# Patient Record
Sex: Male | Born: 1985 | Hispanic: Yes | Marital: Single | Smoking: Former smoker
Health system: Southern US, Community
[De-identification: ages and names within clinical notes are randomized; demographics above are authoritative.]

## PROBLEM LIST (undated history)

## (undated) DIAGNOSIS — K219 Gastro-esophageal reflux disease without esophagitis: Secondary | ICD-10-CM

## (undated) HISTORY — PX: CYST REMOVAL HAND: SHX6279

## (undated) HISTORY — DX: Gastro-esophageal reflux disease without esophagitis: K21.9

---

## 2010-09-30 ENCOUNTER — Emergency Department (HOSPITAL_COMMUNITY)
Admission: EM | Admit: 2010-09-30 | Discharge: 2010-09-30 | Payer: Self-pay | Source: Home / Self Care | Admitting: Emergency Medicine

## 2010-10-02 LAB — URINE CULTURE
Colony Count: NO GROWTH
Culture  Setup Time: 201201161547
Culture: NO GROWTH

## 2010-10-02 LAB — URINALYSIS, ROUTINE W REFLEX MICROSCOPIC
Bilirubin Urine: NEGATIVE
Hgb urine dipstick: NEGATIVE
Ketones, ur: NEGATIVE mg/dL
Nitrite: NEGATIVE
Protein, ur: NEGATIVE mg/dL
Specific Gravity, Urine: 1.022 (ref 1.005–1.030)
Urine Glucose, Fasting: NEGATIVE mg/dL
Urobilinogen, UA: 0.2 mg/dL (ref 0.0–1.0)
pH: 6 (ref 5.0–8.0)

## 2010-10-02 LAB — GC/CHLAMYDIA PROBE AMP, GENITAL
Chlamydia, DNA Probe: NEGATIVE
GC Probe Amp, Genital: NEGATIVE

## 2014-11-25 ENCOUNTER — Emergency Department (HOSPITAL_COMMUNITY): Payer: Self-pay

## 2014-11-25 ENCOUNTER — Encounter (HOSPITAL_COMMUNITY): Payer: Self-pay | Admitting: Emergency Medicine

## 2014-11-25 ENCOUNTER — Emergency Department (HOSPITAL_COMMUNITY)
Admission: EM | Admit: 2014-11-25 | Discharge: 2014-11-25 | Disposition: A | Discharge status: Home / Self Care | Payer: Self-pay | Attending: Emergency Medicine | Admitting: Emergency Medicine

## 2014-11-25 DIAGNOSIS — R0602 Shortness of breath: Secondary | ICD-10-CM | POA: Insufficient documentation

## 2014-11-25 DIAGNOSIS — R55 Syncope and collapse: Secondary | ICD-10-CM | POA: Insufficient documentation

## 2014-11-25 LAB — BASIC METABOLIC PANEL
Anion gap: 9 (ref 5–15)
BUN: 16 mg/dL (ref 6–23)
CO2: 27 mmol/L (ref 19–32)
Calcium: 9.5 mg/dL (ref 8.4–10.5)
Chloride: 103 mmol/L (ref 96–112)
Creatinine, Ser: 1.11 mg/dL (ref 0.50–1.35)
GFR calc Af Amer: >90 mL/min (ref >90)
GFR calc non Af Amer: 88 mL/min — ABNORMAL LOW (ref >90)
Glucose, Bld: 101 mg/dL — ABNORMAL HIGH (ref 70–99)
Potassium: 3.6 mmol/L (ref 3.5–5.1)
Sodium: 139 mmol/L (ref 135–145)

## 2014-11-25 LAB — CBC
HCT: 43.6 % (ref 39.0–52.0)
Hemoglobin: 15.7 g/dL (ref 13.0–17.0)
MCH: 27.9 pg (ref 26.0–34.0)
MCHC: 36 g/dL (ref 30.0–36.0)
MCV: 77.6 fL — ABNORMAL LOW (ref 78.0–100.0)
Platelets: 314 10*3/uL (ref 150–400)
RBC: 5.62 MIL/uL (ref 4.22–5.81)
RDW: 12.9 % (ref 11.5–15.5)
WBC: 7.8 10*3/uL (ref 4.0–10.5)

## 2014-11-25 LAB — D-DIMER, QUANTITATIVE: D-Dimer, Quant: <0.27 ug/mL-FEU (ref 0.00–0.48)

## 2014-11-25 MED ORDER — SODIUM CHLORIDE 0.9 % IV BOLUS (SEPSIS)
1000.0000 mL | Freq: Once | INTRAVENOUS | Status: AC
  Administered 2014-11-25: 1000 mL via INTRAVENOUS

## 2014-11-25 NOTE — Discharge Instructions (Signed)
Return to the ED with any concerns including fainting, chest pain, difficulty breathing, decreased level of alertness/lethargy, or any other alarming symptoms °

## 2014-11-25 NOTE — ED Notes (Signed)
Pt reports cp with radiation to shoulders and sob since Wednesday.  Pt lung sounds clear, no cardiac hx, alert and oriented.

## 2014-11-25 NOTE — ED Notes (Signed)
Pt c/o shortness of breath onset Thursday. Pt c/o b/l shoulder pain Thursday. Pt reports dizziness also. Symptoms began while at work.

## 2014-11-25 NOTE — ED Provider Notes (Signed)
CSN: [id number]     Arrival date & time 11/25/14  1511 History   First MD Initiated Contact with Patient 11/25/14 1956     Chief Complaint  Patient presents with  . Shortness of Breath     (Consider location/radiation/quality/duration/timing/severity/associated sxs/prior Treatment) HPI  Pt presenting with c/o shortness of breath and associated lightheadedness intermittently over the past 3 days.  No fever/chills.  No cough.  No leg swelling.   No hx of DVT/PE.  No recent travel/trauma/surgery.  No hx of heart or lung problems.  He does state he has had similar symptoms in the past but has not been evaluated by a doctor.  He states when he feels short of breath he has some chest tightness.  Denies shoulder pain as noted in triage note.  He works as a Airline pilotwaiter and at times while working has felt like he may faint.  No syncope. He has been eating and drinking liquids normally.  No vomiting or diarrhea.   There are no other associated systemic symptoms, there are no other alleviating or modifying factors.   History reviewed. No pertinent past medical history. History reviewed. No pertinent past surgical history. No family history on file. History  Substance Use Topics  . Smoking status: Never Smoker   . Smokeless tobacco: Not on file  . Alcohol Use: No    Review of Systems  ROS reviewed and all otherwise negative except for mentioned in HPI    Allergies  Review of patient's allergies indicates no known allergies.  Home Medications   Prior to Admission medications   Medication Sig Start Date End Date Taking? Authorizing Provider  ibuprofen (ADVIL,MOTRIN) 200 MG tablet Take 200 mg by mouth every 6 (six) hours as needed for mild pain or moderate pain.   Yes Historical Provider, MD   BP 107/64 mmHg  Pulse 76  Temp(Src) 98.3 F (36.8 C) (Oral)  Resp 24  Ht 5' (1.524 m)  Wt 175 lb (79.379 kg)  BMI 34.18 kg/m2  SpO2 97%  Vitals reviewed Physical Exam  Physical Examination: General  appearance - alert, well appearing, and in no distress Mental status - alert, oriented to person, place, and time Eyes -no conjunctival injection, no scleral icterus Mouth - mucous membranes moist, pharynx normal without lesions Chest - clear to auscultation, no wheezes, rales or rhonchi, symmetric air entry, normal respiratory effort Heart - normal rate, regular rhythm, normal S1, S2, no murmurs, rubs, clicks or gallops Abdomen - soft, nontender, nondistended, no masses or organomegaly Extremities - peripheral pulses normal, no pedal edema, no clubbing or cyanosis Skin - normal coloration and turgor, no rashes  ED Course  Procedures (including critical care time) Labs Review Labs Reviewed  CBC - Abnormal; Notable for the following:    MCV 77.6 (*)    All other components within normal limits  BASIC METABOLIC PANEL - Abnormal; Notable for the following:    Glucose, Bld 101 (*)    GFR calc non Af Amer 88 (*)    All other components within normal limits  D-DIMER, QUANTITATIVE    Imaging Review Dg Chest 2 View (if Patient Has Fever And/or Copd)  11/25/2014   CLINICAL DATA:  Short of breath, dizziness, lethargy for 4 days  EXAM: CHEST  2 VIEW  COMPARISON:  None.  FINDINGS: The heart size and mediastinal contours are within normal limits. Both lungs are clear. The visualized skeletal structures are unremarkable.  IMPRESSION: No active cardiopulmonary disease.   Electronically Signed  By: Elige Ko   On: 11/25/2014 17:01     EKG Interpretation   Date/Time:  Saturday November 25 2014 22:27:10 EST Ventricular Rate:  66 PR Interval:  190 QRS Duration: 100 QT Interval:  385 QTC Calculation: 403 R Axis:   110 Text Interpretation:  Sinus rhythm Borderline right axis deviation No old  tracing to compare Confirmed by [facility]  MD, Idora Brosious [id number]) on 11/25/2014  10:31:23 PM      MDM   Final diagnoses:  Near syncope  Shortness of breath    Pt presenting with c/o shortness of breath  and near syncope over the past several days.  Workup is reassuring, with reassuring ekg, labs including d-dimer,  cxr reassuring and patient is not orthostatic.  Results d/w patient and he was given information about outpatient followup.  Unclear cause of his syptoms, but low suspciion for ACS, arrythmia, PE, pneumonia or other life threatening process at this time.   Xray images reviewed and interpreted by me as well.  Discharged with strict return precautions.  Pt agreeable with plan.     Jerelyn Scott, MD 11/25/14 [phone]

## 2014-11-25 NOTE — ED Notes (Signed)
Pt made aware to return if symptoms worsen or if any life threatening symptoms occur.   

## 2015-01-22 ENCOUNTER — Ambulatory Visit (INDEPENDENT_AMBULATORY_CARE_PROVIDER_SITE_OTHER): Payer: Self-pay | Admitting: Internal Medicine

## 2015-01-22 VITALS — BP 118/70 | HR 64 | Temp 98.0°F | Resp 16 | Ht 62.0 in | Wt 169.8 lb

## 2015-01-22 DIAGNOSIS — R102 Pelvic and perineal pain: Secondary | ICD-10-CM

## 2015-01-22 DIAGNOSIS — R519 Headache, unspecified: Secondary | ICD-10-CM

## 2015-01-22 DIAGNOSIS — R51 Headache: Secondary | ICD-10-CM

## 2015-01-22 NOTE — Progress Notes (Signed)
Subjective:  This chart was scribed for Thomas Siaobert Tashala Cumbo, MD by Stann Oresung-Kai Tsai, Medical Scribe. This patient was seen in Room 4 and the patient's care was started at 2:53 PM.     Patient ID: Thomas Payne, male    DOB: 08-08-1986, 29 y.o.   MRN: [id number]  HPI Thomas Payne is a 29 y.o. male who [facility] complaining of headache and dizziness that comes and goes  while at work starting 2 months ago. At that time he had a sudden episode of acute dizziness with a pop in his head. He also had shortness of breath and weakness and was taken to the emergency room. Evaluation there was negative as outlined in the chart. He states he is now getting a headache and dizziness for a few seconds at most, coming in a random fashion and not inhibiting continuing his activity. He denies of feeling nausea, coughing. Denies dyspnea on exertion. Denies chest pain or palpitations. No syncope. Denies sleep issues. No past history of illness requiring medication.  Previously, he saw a doctor at [facility] for months ago for his testicular and groin pain and was prescribed to take anti inflammatory medication and antibiotics even though all his studies documented no evidence of any infection as outlined in the chart. He states relief after taking medication (amoxicillin $70 and Relafen $96) but pain came back recently. He reports of testicular pain sometimes in the left and sometimes in the right. He also mentions 2 nocturia episodes per night. Never has dysuria or discharge or problems with sexual functioning. Married with only 1 partner. 2 kids. The evaluation in the chart indicates testicular ultrasound normal among other studies. He took time off from work for a month and has since returned. He reports the same pain for 5 years or more and no one is ever found an answer. Denies constipation and diarrhea. He's never had a prostate infection.  He denies asthma, DM, and HTN.      Review of  Systems  Constitutional: Negative for fever, activity change and appetite change.       He sometimes has feelings of fatigue at work His exercise habits are Nil outside of work  Eyes: Negative for photophobia and visual disturbance.  Respiratory: Negative for cough and shortness of breath.   Cardiovascular: Negative for chest pain and palpitations.  Gastrointestinal: Negative for nausea and vomiting.  Genitourinary: Positive for testicular pain. Negative for dysuria, flank pain, discharge, penile swelling, scrotal swelling, difficulty urinating and genital sores.  Neurological: Positive for dizziness and headaches. Negative for tremors, speech difficulty and weakness.  Psychiatric/Behavioral: Negative for sleep disturbance.       He has a history of a mother who is frequently nervous but she is also had a stroke.       Objective:   Physical Exam  Constitutional: He is oriented to person, place, and time. He appears well-developed and well-nourished. No distress.  HENT:  Head: Normocephalic and atraumatic.  Mouth/Throat: Oropharynx is clear and moist. No oropharyngeal exudate.  Eyes: EOM are normal. Pupils are equal, round, and reactive to light.  Neck: Normal range of motion. Neck supple. No thyromegaly present.  Cardiovascular: Normal rate, regular rhythm, normal heart sounds and intact distal pulses.   No murmur heard. Pulmonary/Chest: Effort normal and breath sounds normal.  Abdominal: Bowel sounds are normal. He exhibits no distension and no mass. There is no tenderness. There is no rebound and no guarding.  Genitourinary:  Has mildly  tender palpation in both inguinal areas; testes are normal without mass or tenderness; phallus uncircumcised and normal; no inguinal hernia  Musculoskeletal: He exhibits no edema.  Neurological: He is alert and oriented to person, place, and time. No cranial nerve deficit.  Skin: Skin is dry. No rash noted.  Psychiatric:  He is not overly anxious or  depressed. His thought content is normal although very concerned about his symptoms. There are no flights of ideas.  Nursing note and vitals reviewed.         Assessment & Plan:  Pelvic pain in male  Nonintractable episodic headache, unspecified headache type  At this point I have no etiology for any of his concerns. It seems that all the correct test were done and everything is negative. He has had no episodes like the one he had in March that had him go as an emergency to the ER. I suggested he try to get back-physical shape and consider dietary changes that might allow weight loss. I have given him directions for the next area where evaluation might be necessary if he is not willing to accept the current state of affairs  I have completed the patient encounter in its entirety as documented by the scribe, with editing by me where necessary. Lashon Beringer P. Merla Richesoolittle, M.D.

## 2015-01-22 NOTE — Patient Instructions (Signed)
For headache/dizzy consider neurology--[facility] For testicle pain consider [facility]

## 2017-03-09 ENCOUNTER — Encounter (HOSPITAL_COMMUNITY): Payer: Self-pay | Admitting: Emergency Medicine

## 2017-03-09 ENCOUNTER — Ambulatory Visit (HOSPITAL_COMMUNITY)
Admission: EM | Admit: 2017-03-09 | Discharge: 2017-03-09 | Disposition: A | Discharge status: Home / Self Care | Payer: Self-pay | Attending: Family Medicine | Admitting: Family Medicine

## 2017-03-09 DIAGNOSIS — K228 Other specified diseases of esophagus: Secondary | ICD-10-CM

## 2017-03-09 DIAGNOSIS — R198 Other specified symptoms and signs involving the digestive system and abdomen: Secondary | ICD-10-CM

## 2017-03-09 DIAGNOSIS — K5901 Slow transit constipation: Secondary | ICD-10-CM

## 2017-03-09 DIAGNOSIS — R0600 Dyspnea, unspecified: Secondary | ICD-10-CM

## 2017-03-09 NOTE — ED Provider Notes (Signed)
CSN: [id number]     Arrival date & time 03/09/17  1410 History   First MD Initiated Contact with Patient 03/09/17 1538     Chief Complaint  Patient presents with  . 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 He is able to eat and drink normally. No regurgitation or vomiting. Denies abdominal pain. He also complains of shortness of breath. It tends to come and go. It is not exacerbated or elicited by exertion. It may occur at rest, sleep or at work. His job is primarily working outside is complaining of headaches, dizziness and fatigue.      History reviewed. No pertinent past medical history. History reviewed. No pertinent surgical history. History reviewed. No pertinent family history. Social History  Substance Use Topics  . Smoking status: Never Smoker  . Smokeless tobacco: Never Used  . Alcohol use No    Review of Systems  Constitutional: Positive for activity change and fatigue. Negative for fever.  HENT: Negative.   Eyes: Negative for visual disturbance.  Respiratory: Positive for shortness of breath.   Cardiovascular: Negative for leg swelling.  Gastrointestinal: Negative for abdominal pain, nausea and vomiting.  Genitourinary: Negative.   Skin: Negative.   Neurological: Positive for dizziness and headaches.  All other systems reviewed and are negative.   Allergies  Patient has no known allergies.  Home Medications   Prior to Admission medications   Medication Sig Start Date End Date Taking? Authorizing Provider  ibuprofen (ADVIL,MOTRIN) 200 MG tablet Take 200 mg by mouth every 6 (six) hours as needed for mild pain or moderate pain.    [provider]   Meds Ordered and Administered this Visit  Medications - No data to  display  Pulse (!) 53   Temp 97.7 F (36.5 C) (Oral)   Resp 20   SpO2 99%  No data found.   Physical Exam  Constitutional: He is oriented to person, place, and time. He appears well-developed and well-nourished. No distress.  Healthy-appearing 31 year old Hispanic male in no distress whatsoever. Sitting on the exam table with completely relaxed posturing. Respirations are slow, regular and even. He is swallowing and controlling his secretions.   HENT:  Head: Normocephalic and atraumatic.  Mouth/Throat: Oropharynx is clear and moist. No oropharyngeal exudate.  Airway widely patent. Oropharynx clear, no drainage, no erythema. Swallowing reflex is normal.  Eyes: Conjunctivae and EOM are normal.  Neck: Normal range of motion. Neck supple.  Cardiovascular: Normal rate, regular rhythm, normal heart sounds and intact distal pulses.  Exam reveals no gallop and no friction rub.   No murmur heard. Pulmonary/Chest: Effort normal and breath sounds normal. No respiratory distress. He has no wheezes. He has no rales.  Abdominal: Soft. Bowel sounds are normal. He exhibits no distension. There is no rebound.  Minor tenderness across the lower abdomen. Percussion reveals dullness/flatness at the umbilicus and below. Tympanic in the epigastrium only. No epigastric tenderness.  Musculoskeletal: Normal range of motion. He exhibits no edema or tenderness.  Lymphadenopathy:    He has no cervical adenopathy.  Neurological: He is alert and oriented to person, place, and time. He exhibits normal muscle tone.  Skin: Skin is warm and dry. No rash noted. He is not diaphoretic.  Psychiatric: He has a normal mood and affect. His behavior is normal. Thought content normal.  Nursing note  and vitals reviewed.   Urgent Care Course     Procedures (including critical care time)  Labs Review Labs Reviewed - No data to display  Imaging Review No results found.   Visual Acuity Review  Right Eye Distance:    Left Eye Distance:   Bilateral Distance:    Right Eye Near:   Left Eye Near:    Bilateral Near:         MDM   1. Slow transit constipation   2. Dyspnea, unspecified type   3. Sensation of foreign body in esophagus    The reason for the sensation of a foreign body or stricture in your esophagus is unclear. He will likely need to see a gastroenterologist. As of now your able to swallow food and water so this is not an emergent at this time. Your abdomen exam is normal with exception of findings consistent with constipation. Your lungs are clear in your breathing normally. Do not see any evidence or concerns at this time offering a reason for shortness of breath. Many of your other symptoms come from working in the heat. Stay cool as often as possible and drink plenty of fluids.    Thomas Payne, Thomas Sterba, NP 03/09/17 1601

## 2017-03-09 NOTE — Discharge Instructions (Signed)
The reason for the sensation of a foreign body or stricture in your esophagus is unclear. He will likely need to see a gastroenterologist. As of now your able to swallow food and water so this is not an emergent at this time. Your abdomen exam is normal with exception of findings consistent with constipation. Your lungs are clear in your breathing normally. Do not see any evidence or concerns at this time offering a reason for shortness of breath. Many of your other symptoms come from working in the heat. Stay cool as often as possible and drink plenty of fluids. Miralax for constipation as explained

## 2017-03-09 NOTE — ED Triage Notes (Signed)
Pt c/o intermittent epigastric pain onset 2 weeks that causes dyspnea, HA, weakness, blurred vision, dizziness  Sts he feels like something is lodged in the "mouth of the stomach"  Denies n/v/d, fevers,   A&O x4... NAD... Ambulatory

## 2017-05-21 ENCOUNTER — Encounter (HOSPITAL_COMMUNITY): Payer: Self-pay | Admitting: *Deleted

## 2017-05-21 ENCOUNTER — Emergency Department (HOSPITAL_COMMUNITY): Payer: Self-pay

## 2017-05-21 ENCOUNTER — Emergency Department (HOSPITAL_COMMUNITY)
Admission: EM | Admit: 2017-05-21 | Discharge: 2017-05-21 | Disposition: A | Discharge status: Home / Self Care | Payer: Self-pay | Attending: Emergency Medicine | Admitting: Emergency Medicine

## 2017-05-21 DIAGNOSIS — N50819 Testicular pain, unspecified: Secondary | ICD-10-CM

## 2017-05-21 DIAGNOSIS — N503 Cyst of epididymis: Secondary | ICD-10-CM | POA: Insufficient documentation

## 2017-05-21 DIAGNOSIS — N433 Hydrocele, unspecified: Secondary | ICD-10-CM | POA: Insufficient documentation

## 2017-05-21 DIAGNOSIS — Z791 Long term (current) use of non-steroidal anti-inflammatories (NSAID): Secondary | ICD-10-CM | POA: Insufficient documentation

## 2017-05-21 DIAGNOSIS — R0602 Shortness of breath: Secondary | ICD-10-CM | POA: Insufficient documentation

## 2017-05-21 DIAGNOSIS — R0789 Other chest pain: Secondary | ICD-10-CM | POA: Insufficient documentation

## 2017-05-21 LAB — URINALYSIS, ROUTINE W REFLEX MICROSCOPIC
Bilirubin Urine: NEGATIVE
GLUCOSE, UA: NEGATIVE mg/dL
HGB URINE DIPSTICK: NEGATIVE
Ketones, ur: NEGATIVE mg/dL
LEUKOCYTES UA: NEGATIVE
Nitrite: NEGATIVE
PROTEIN: NEGATIVE mg/dL
SPECIFIC GRAVITY, URINE: 1.027 (ref 1.005–1.030)
pH: 6 (ref 5.0–8.0)

## 2017-05-21 LAB — I-STAT TROPONIN, ED
Troponin i, poc: 0 ng/mL (ref 0.00–0.08)
Troponin i, poc: 0 ng/mL (ref 0.00–0.08)

## 2017-05-21 LAB — CBC
HCT: 43.1 % (ref 39.0–52.0)
Hemoglobin: 15 g/dL (ref 13.0–17.0)
MCH: 28.2 pg (ref 26.0–34.0)
MCHC: 34.8 g/dL (ref 30.0–36.0)
MCV: 81 fL (ref 78.0–100.0)
Platelets: 255 10*3/uL (ref 150–400)
RBC: 5.32 MIL/uL (ref 4.22–5.81)
RDW: 13 % (ref 11.5–15.5)
WBC: 6.1 10*3/uL (ref 4.0–10.5)

## 2017-05-21 LAB — BASIC METABOLIC PANEL
Anion gap: 9 (ref 5–15)
BUN: 15 mg/dL (ref 6–20)
CO2: 24 mmol/L (ref 22–32)
Calcium: 9.5 mg/dL (ref 8.9–10.3)
Chloride: 106 mmol/L (ref 101–111)
Creatinine, Ser: 1.01 mg/dL (ref 0.61–1.24)
GFR calc Af Amer: >60 mL/min (ref >60)
GFR calc non Af Amer: >60 mL/min (ref >60)
Glucose, Bld: 101 mg/dL — ABNORMAL HIGH (ref 65–99)
Potassium: 4.1 mmol/L (ref 3.5–5.1)
Sodium: 139 mmol/L (ref 135–145)

## 2017-05-21 MED ORDER — ACETAMINOPHEN 325 MG PO TABS
650.0000 mg | ORAL_TABLET | Freq: Once | ORAL | Status: AC
  Administered 2017-05-21: 650 mg via ORAL
  Filled 2017-05-21: qty 2

## 2017-05-21 MED ORDER — GI COCKTAIL ~~LOC~~
30.0000 mL | Freq: Once | ORAL | Status: AC
  Administered 2017-05-21: 30 mL via ORAL
  Filled 2017-05-21: qty 30

## 2017-05-21 NOTE — ED Provider Notes (Signed)
[facility]Y DEPT Provider Note   CSN: [id number] Arrival date & time: 05/21/17  1017     History   Chief Complaint Chief Complaint  Patient presents with  . Chest Pain  . Testicle Pain    HPI Thomas Payne is a 31 y.o. male who presents today for evaluation of one year of intermittent left upper chest pain, 5 years of intermittent pelvic/testicular pain. He reports that he came today because he felt like he was having a hard time breathing which is not abnormal for him.    He reports that his chest pain feels worse after he eats spicy food, denies any alleviating factors his chest pain has been intermittent, is sometimes on the right anterior chest, right back, and sometimes on the left anterior chest.  He reports that right now his pain is currently a 8 out of 10 and feels achy, "like inflammation."  He denies any abdominal pain, nausea, or vomiting.   He reports that his pelvic/testicular pain is currently a 9 out of 10, is sometimes on one side, sometimes on the other side, and sometimes in both sides. He currently reports that the pain is in both sides equally. He denies any difficulty urinating, or changes to urination.  He denies any trauma to the area, reports he is sexually active with one woman.  No diarrhea or constipation.   Chart review shows that he was seen by family medicine for pelvic pain in 2016 and according to the note at that pain he had had the same pain for 5 years along with normal testicular ultrasound that was performed in 2012.     HPI  History reviewed. No pertinent past medical history.  There are no active problems to display for this patient.   History reviewed. No pertinent surgical history.         Home Medications    Prior to Admission medications   Medication Sig Start Date End Date Taking? Authorizing Provider  ibuprofen (ADVIL,MOTRIN) 200 MG tablet Take 200 mg by mouth every 6 (six) hours as needed for mild pain or moderate pain.     [provider]    Family History No family history on file.  Social History Social History  Substance Use Topics  . Smoking status: Never Smoker  . Smokeless tobacco: Never Used  . Alcohol use No     Allergies   Patient has no known allergies.   Review of Systems Review of Systems  Constitutional: Negative for chills and fever.  HENT: Negative for ear pain and sore throat.   Eyes: Negative for pain and visual disturbance.  Respiratory: Positive for shortness of breath. Negative for cough.   Cardiovascular: Positive for chest pain. Negative for palpitations and leg swelling.  Gastrointestinal: Negative for abdominal pain, nausea and vomiting.  Genitourinary: Positive for testicular pain. Negative for dysuria, frequency, hematuria, penile pain, penile swelling, scrotal swelling and urgency.  Musculoskeletal: Negative for arthralgias and back pain.  Skin: Negative for color change and rash.  Neurological: Negative for seizures, syncope and headaches.  Psychiatric/Behavioral: The patient is nervous/anxious.   All other systems reviewed and are negative.    Physical Exam Updated Vital Signs BP 118/76 (BP Location: Right Arm)   Pulse 60   Temp 98.1 F (36.7 C) (Oral)   Resp 18   SpO2 100%   Physical Exam  Constitutional: He appears well-developed and well-nourished.  HENT:  Head: Normocephalic and atraumatic.  Mouth/Throat: Oropharynx is clear and moist.  Eyes: Conjunctivae are normal.  Neck: Neck supple. No JVD present.  Cardiovascular: Normal rate, regular rhythm and intact distal pulses.  Exam reveals no gallop and no friction rub.   No murmur heard. Pulmonary/Chest: Effort normal and breath sounds normal. No respiratory distress. He has no wheezes. He has no rales. He exhibits tenderness (Recreated and worsened with palpation of left anterior pec. ).  Abdominal: Soft. Bowel sounds are normal. He exhibits no distension. There is no tenderness. There is  no guarding. Hernia confirmed negative in the right inguinal area and confirmed negative in the left inguinal area.  Genitourinary: Penis normal. Right testis shows tenderness (Mild tenderness to palpation over posterior aspect). Right testis shows no mass and no swelling. Left testis shows no mass, no swelling and no tenderness. Uncircumcised. No discharge found.  Musculoskeletal: He exhibits no edema.  Neurological: He is alert.  Skin: Skin is warm and dry. No rash noted.  Psychiatric: He has a normal mood and affect. His behavior is normal.  Nursing note and vitals reviewed.    ED Treatments / Results  Labs (all labs ordered are listed, but only abnormal results are displayed) Labs Reviewed  BASIC METABOLIC PANEL - Abnormal; Notable for the following:       Result Value   Glucose, Bld 101 (*)    All other components within normal limits  CBC  URINALYSIS, ROUTINE W REFLEX MICROSCOPIC  I-STAT TROPONIN, ED  I-STAT TROPONIN, ED  GC/CHLAMYDIA PROBE AMP ([place]) NOT AT [facility]    EKG  EKG Interpretation  Date/Time:  Thursday May 21 2017 10:22:29 EDT Ventricular Rate:  60 PR Interval:  172 QRS Duration: 98 QT Interval:  404 QTC Calculation: 404 R Axis:   88 Text Interpretation:  Normal sinus rhythm Normal ECG no acute changes  Confirmed by Crista Curb [phone]) on 05/21/2017 2:07:47 PM       Radiology Dg Chest 2 View  Result Date: 05/21/2017 CLINICAL DATA:  Left sided upper chest pain x 1 year, occasional SOB, nonsmoker EXAM: CHEST - 2 VIEW COMPARISON:  11/25/2014 FINDINGS: Lungs are clear. Heart size and mediastinal contours are within normal limits. No effusion.  No pneumothorax. Visualized bones unremarkable. IMPRESSION: No acute cardiopulmonary disease. Electronically Signed   By: Corlis Leak M.D.   On: 05/21/2017 10:43   US Scrotum  Addendum Date: 05/21/2017   ADDENDUM REPORT: 05/21/2017 16:54 ADDENDUM: Positive color Doppler flow with positive/normal arterial and  venous waveforms are identified in bilateral testis. Electronically Signed   By: Sherian Rein M.D.   On: 05/21/2017 16:54   Result Date: 05/21/2017 CLINICAL DATA:  Testicular pain bilaterally for 5 years. EXAM: ULTRASOUND OF SCROTUM TECHNIQUE: Complete ultrasound examination of the testicles, epididymis, and other scrotal structures was performed. COMPARISON:  None. FINDINGS: Right testicle Measurements: 4.5 x 2.4 x 3.1 cm. No mass or microlithiasis visualized. Left testicle Measurements: 4.3 x 2.2 x 3.1 cm. No mass or microlithiasis visualized. Right epididymis: There is a 0.2 cm simple cyst in the right epididymis. Left epididymis:  Normal in size and appearance. Hydrocele:  There are small bilateral hydroceles. Varicocele:  None visualized. IMPRESSION: No evidence of testicular abnormality. There are small bilateral hydroceles. Tiny right epididymal cyst. Electronically Signed: By: Sherian Rein M.D. On: 05/21/2017 15:01   [place] Art/ven Flow Abd Pelv Doppler  Addendum Date: 05/21/2017   ADDENDUM REPORT: 05/21/2017 16:54 ADDENDUM: Positive color Doppler flow with positive/normal arterial and venous waveforms are identified in bilateral testis. Electronically Signed  By: Sherian Rein M.D.   On: 05/21/2017 16:54   Result Date: 05/21/2017 CLINICAL DATA:  Testicular pain bilaterally for 5 years. EXAM: ULTRASOUND OF SCROTUM TECHNIQUE: Complete ultrasound examination of the testicles, epididymis, and other scrotal structures was performed. COMPARISON:  None. FINDINGS: Right testicle Measurements: 4.5 x 2.4 x 3.1 cm. No mass or microlithiasis visualized. Left testicle Measurements: 4.3 x 2.2 x 3.1 cm. No mass or microlithiasis visualized. Right epididymis: There is a 0.2 cm simple cyst in the right epididymis. Left epididymis:  Normal in size and appearance. Hydrocele:  There are small bilateral hydroceles. Varicocele:  None visualized. IMPRESSION: No evidence of testicular abnormality. There are small bilateral  hydroceles. Tiny right epididymal cyst. Electronically Signed: By: Sherian Rein M.D. On: 05/21/2017 15:01    Procedures Procedures (including critical care time)  Medications Ordered in ED Medications  gi cocktail (Maalox,Lidocaine,Donnatal) (30 mLs Oral Given 05/21/17 1419)  acetaminophen (TYLENOL) tablet 650 mg (650 mg Oral Given 05/21/17 1600)     Initial Impression / Assessment and Plan / ED Course  I have reviewed the triage vital signs and the nursing notes.  Pertinent labs & imaging results that were available during my care of the patient were reviewed by me and considered in my medical decision making (see chart for details).  Clinical Course as of May 21 1729  Thu May 21, 2017  1417 Spoke with nurse first regarding room availability for testicular exam, will put patient in room 46 after that patient gets discharged for testicular exam and then moved back to hallway.  [EH]  1418 Using a video interpreter updated patient on plan of treatment and obtained a more detailed history. Patient was made aware of need for ultrasound.  [EH]  1548 Patient rechecked, chest pain is unchanged.  He requests Tylenol for his pain.  [EH]    Clinical Course User Index [EH] Cristina Gong, PA-C    Patient is to be discharged with recommendation to follow up with PCP in regards to today's hospital visit. Chest pain is not likely of cardiac or pulmonary etiology d/t presentation, PERC negative, VSS, no tracheal deviation, no JVD or new murmur, RRR, breath sounds equal bilaterally, EKG without acute abnormalities, negative troponin, and negative CXR. Pt has been advised to return to the ED if CP becomes exertional, associated with diaphoresis or nausea, radiates to left jaw/arm, worsens or becomes concerning in any way. Heart score 0- low risk, also with similar chest pain on and off for over one year low suspicion for cardiopulmonary cause of CP. Suspect that his reported episode of shortness of  breath was secondary to anxiety. Pt appears reliable for follow up and is agreeable to discharge.  As his pain is recreatable with palpation of the chest, I suspect that it is musculoskeletal in nature and have instructed him to take ibuprofen and Tylenol as needed for his pain.  He complains of intermittent bilateral testicular pain that has been present since at least 2012. He reports that it worsened 2 days ago and testicular ultrasound was performed which showed a right-sided epididymal cyst that has minimal  grown since 2012 when it was reported as 1.49mm and it is now 2mm.  I have a low suspicion for epididymitis given otherwise normal scrotal ultrasound. Patient does not have penile discharge however will send for gonorrhea chlamydia as he is sexually active.  Torsion considered however normal pelvic Doppler showing blood flow to both testes. Will give him follow up with urology  for his continued pain.  Given he has been having similar pains for the past 6+ years very low suspicion for acute pathology.  Case has been discussed with and seen by Dr. Verdie Mosher who agrees with the above plan to discharge.    Final Clinical Impressions(s) / ED Diagnoses   Final diagnoses:  Testicle pain  Chest wall pain  Cyst of epididymis determined by ultrasound  Hydrocele, bilateral    New Prescriptions Discharge Medication List as of 05/21/2017  4:29 PM       Cristina Gong, PA-C 05/21/17 1731    Lavera Guise, MD 05/22/17 [phone]

## 2017-05-21 NOTE — ED Triage Notes (Signed)
Pt states that he has upper abdominal pain and chest pain that has been ongoing for 1 year. Pt states that he feels like it is hard to breath.

## 2017-05-21 NOTE — ED Notes (Signed)
Pt verbalized understanding of d/c instructions and has no further questions. All instructions review with the PA and this RN via the interpreter IPAD.

## 2017-05-21 NOTE — ED Notes (Signed)
Ambulated the pt down the hallway on the pulse ox.  SpO2 was 100% the entire time ambulating.

## 2017-05-21 NOTE — ED Notes (Signed)
Pt's pulled to private room for testicular exam by PA assisted by this RN

## 2017-05-21 NOTE — Discharge Instructions (Signed)

## 2017-05-21 NOTE — ED Notes (Signed)
Pt transported to US

## 2017-05-22 LAB — GC/CHLAMYDIA PROBE AMP (~~LOC~~) NOT AT ARMC
CHLAMYDIA, DNA PROBE: NEGATIVE
NEISSERIA GONORRHEA: NEGATIVE

## 2017-06-03 ENCOUNTER — Ambulatory Visit: Payer: Self-pay | Attending: Internal Medicine | Admitting: Physician Assistant

## 2017-06-03 ENCOUNTER — Encounter: Payer: Self-pay | Admitting: Physician Assistant

## 2017-06-03 VITALS — BP 110/73 | HR 66 | Temp 98.1°F | Resp 18 | Ht 62.0 in | Wt 166.4 lb

## 2017-06-03 DIAGNOSIS — N503 Cyst of epididymis: Secondary | ICD-10-CM

## 2017-06-03 DIAGNOSIS — N50819 Testicular pain, unspecified: Secondary | ICD-10-CM

## 2017-06-03 DIAGNOSIS — S29011A Strain of muscle and tendon of front wall of thorax, initial encounter: Secondary | ICD-10-CM

## 2017-06-03 MED ORDER — IBUPROFEN 600 MG PO TABS: 600.0000 mg | ORAL_TABLET | Freq: Three times a day (TID) | ORAL | 0 refills | Status: DC | PRN

## 2017-06-03 MED FILL — IBUPROFEN 600 MG TABS: 600 | 20 days supply | Qty: 60 | Fill #0

## 2017-06-03 NOTE — Progress Notes (Signed)
Patient ID: Thomas Payne, male   DOB: 04-May-1986, 31 y.o.   MRN: [id number]    Thomas Payne, is a 31 y.o. male  [id number]:[id number]  [id number]  DOB - [birth year]  Subjective:  Chief Complaint and HPI: Thomas Payne is a 31 y.o. male here today to establish care and for a follow up visit After being seen in the ED 05/21/2017 for intermittent upper L chest pain, 5 yrs intermittent testicular pain, and SOB.  Cardiac enzymes negative, EKG with no acute changes.  U/S testicles unremarkable except and epididymal cyst of 2mm.  GC/Chlamydia negative. BMP/D-dimer/CBC WNL.    No known health problems.    "Erie Noe" with [employer] translating.  CP has been for 4-5 years.  Testicular pain X 4-5 years; both testicles, occurs daily, no pattern.  No mass, no lump.  No exacerbating/alleviating factors.  Supposed to make appt with Ihor Gully; urology but hasn't made appt yet.   ED/Hospital notes reviewed and summarized above. 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 EENT:  No vision changes, No blurry vision, No hearing changes. No mouth, throat, or ear problems.  Respiratory: No cough, No SOB Cardiac: No CP, no palpitations GI:  No abd pain, No N/V/D. GU: No Urinary s/sx; +B testicular pain Musculoskeletal: +daily chest pain X several years Neuro: No headache, no dizziness, no motor weakness.  Skin: No rash Endocrine:  No polydipsia. No polyuria.  Psych: Denies SI/HI  No problems updated.  ALLERGIES: No Known Allergies  PAST MEDICAL HISTORY: No past medical history on file.  MEDICATIONS AT HOME: Prior to Admission medications   Medication Sig Start Date End Date Taking? Authorizing Provider  ibuprofen (ADVIL,MOTRIN) 600 MG tablet Take 1 tablet (600 mg total) by mouth every 8 (eight) hours as needed. With food  06/03/17   Anders Simmonds, PA-C     Objective:  EXAM:   Vitals:   06/03/17 1411  BP: 110/73  Pulse: 66  Resp: 18  Temp: 98.1 F (36.7 C)  TempSrc: Oral  SpO2: 93%  Weight: 166 lb 6.4 oz (75.5 kg)  Height:  (1.575 m)    General appearance : A&OX3. NAD. Non-toxic-appearing HEENT: Atraumatic and Normocephalic.  PERRLA. EOM intact.  Neck: supple, no JVD. No cervical lymphadenopathy. No thyromegaly Chest/Lungs:  Breathing-non-labored, Good air entry bilaterally, breath sounds normal without rales, rhonchi, or wheezing.  Mild chest wall TTP. CVS: S1 S2 regular, no murmurs, gallops, rubs  Extremities: Bilateral Lower Ext shows no edema, both legs are warm to touch with = pulse throughout Neurology:  CN II-XII grossly intact, Non focal.   Psych:  TP linear. J/I WNL. Normal speech. Appropriate eye contact and affect.  Skin:  No Rash  Data Review No results found for: HGBA1C   Assessment & Plan   1. Muscle strain of chest wall, initial encounter Negative cardiac testing - ibuprofen (ADVIL,MOTRIN) 600 MG tablet; Take 1 tablet (600 mg total) by mouth every 8 (eight) hours as needed. With food  Dispense: 60 tablet; Refill: 0  2. Epididymal cyst - Ambulatory referral to Urology  3. Testicular pain - Ambulatory referral to Urology   Patient have been counseled extensively about nutrition and exercise  Return in about 6 weeks (around 07/15/2017) for assign PCP/estalish care; CPE/f/up chest wall pain.  The patient was given clear instructions to go to  ER or return to medical center if symptoms don't improve, worsen or new problems develop. The patient verbalized understanding. The patient was told to call to get lab results if they haven't heard anything in the next week.     Georgian Co, PA-C [facility] [place], [place] [phone]   06/03/2017, 2:24 PM

## 2017-07-06 ENCOUNTER — Encounter: Payer: Self-pay | Admitting: Gastroenterology

## 2017-07-16 ENCOUNTER — Ambulatory Visit: Payer: Self-pay | Admitting: Family Medicine

## 2017-08-31 ENCOUNTER — Ambulatory Visit: Payer: Self-pay | Admitting: Gastroenterology

## 2017-09-17 ENCOUNTER — Encounter: Payer: Self-pay | Admitting: Gastroenterology

## 2017-09-17 ENCOUNTER — Ambulatory Visit: Payer: Self-pay | Admitting: Gastroenterology

## 2017-09-17 VITALS — BP 110/60 | HR 72 | Ht 61.0 in | Wt 172.2 lb

## 2017-09-17 DIAGNOSIS — R1310 Dysphagia, unspecified: Secondary | ICD-10-CM

## 2017-09-17 DIAGNOSIS — K219 Gastro-esophageal reflux disease without esophagitis: Secondary | ICD-10-CM

## 2017-09-17 DIAGNOSIS — R1013 Epigastric pain: Secondary | ICD-10-CM

## 2017-09-17 NOTE — Patient Instructions (Signed)
If you are age 33 or older, your body mass index should be between 23-30. Your Body mass index is 32.55 kg/m. If this is out of the aforementioned range listed, please consider follow up with your Primary Care Provider.  If you are age 33 or younger, your body mass index should be between 19-25. Your Body mass index is 32.55 kg/m. If this is out of the aformentioned range listed, please consider follow up with your Primary Care Provider.   Please go to [facility] to purchase over the counter Nexium (Esomeprazole). You will need to take 40 mg daily.  You have been scheduled for a Barium Esophogram at [facility] (1st floor of the hospital) on January 11th at 10:30 AM. Please arrive 15 minutes prior to your appointment for registration. Make certain not to have anything to eat or drink 3 hours prior to your test. If you need to reschedule for any reason, please contact radiology at [phone] to do so. __________________________________________________________________ A barium swallow is an examination that concentrates on views of the esophagus. This tends to be a double contrast exam (barium and two liquids which, when combined, create a gas to distend the wall of the oesophagus) or single contrast (non-ionic iodine based). The study is usually tailored to your symptoms so a good history is essential. Attention is paid during the study to the form, structure and configuration of the esophagus, looking for functional disorders (such as aspiration, dysphagia, achalasia, motility and reflux) EXAMINATION You may be asked to change into a gown, depending on the type of swallow being performed. A radiologist and radiographer will perform the procedure. The radiologist will advise you of the type of contrast selected for your procedure and direct you during the exam. You will be asked to stand, sit or lie in several different positions and to hold a small amount of fluid in your mouth  before being asked to swallow while the imaging is performed .In some instances you may be asked to swallow barium coated marshmallows to assess the motility of a solid food bolus. The exam can be recorded as a digital or video fluoroscopy procedure. POST PROCEDURE It will take 1-2 days for the barium to pass through your system. To facilitate this, it is important, unless otherwise directed, to increase your fluids for the next 24-48hrs and to resume your normal diet.  This test typically takes about 30 minutes to perform. __________________________________________________________________________________

## 2017-09-17 NOTE — Progress Notes (Addendum)
     09/17/2017 Thomas Payne Thomas Payne [id number] 11/28/1985   HISTORY OF PRESENT ILLNESS: This is a 32 year old male who is new to our practice.  He is primarily Spanish-speaking so he is here today with an interpreter.  He presents to the office today with complaints of epigastric abdominal pain, some mild dysphasia, and reflux.  He tells me that his symptoms have been present for about the past 3 years.  He says that he tried some over-the-counter omeprazole for a few weeks previously and may be noticed some mild improvement in his symptoms.  He says that occasionally solid food feels like it gets stuck but really he just feels like that there is constantly something stuck in his lower esophagus.  No problems with swallowing liquids.  Describes the pain as a burning or churning.  Some nausea as well but no vomiting.  He denies any weight loss.  Overall moves his bowels well with occasional constipation.  Currently only using zantac on occasion for his symptoms.   History reviewed. No pertinent past medical history. Past Surgical History:  Procedure Laterality Date  . CYST REMOVAL HAND Left     reports that he quit smoking about 5 years ago. he has never used smokeless tobacco. He reports that he does not drink alcohol or use drugs. family history is not on file. No Known Allergies    Outpatient Encounter Medications as of 09/17/2017  Medication Sig  . ibuprofen (ADVIL,MOTRIN) 600 MG tablet Take 1 tablet (600 mg total) by mouth every 8 (eight) hours as needed. With food  . ranitidine (ZANTAC) 150 MG tablet Take 150 mg by mouth as needed for heartburn.   No facility-administered encounter medications on file as of 09/17/2017.      REVIEW OF SYSTEMS  : All other systems reviewed and negative except where noted in the History of Present Illness.   PHYSICAL EXAM: BP 110/60 (BP Location: Left Arm, Patient Position: Sitting, Cuff Size: Normal)   Pulse 72   Ht 5\' 1"  (1.549 m) Comment: height  measured without shoes  Wt 172 lb 4 oz (78.1 kg)   BMI 32.55 kg/m  General: Well developed Hispanic male in no acute distress Head: Normocephalic and atraumatic Eyes:  Sclerae anicteric, conjunctiva pink. Ears: Normal auditory acuity. Lungs: Clear throughout to auscultation; no increased WOB. Heart: Regular rate and rhythm; no M/R/G. Abdomen: Soft, non-distended.  BS present.  Minimal epigastric TTP. Musculoskeletal: Symmetrical with no gross deformities  Skin: No lesions on visible extremities Extremities: No edema  Neurological: Alert oriented x 4, grossly non-focal Psychological:  Alert and cooperative. Normal mood and affect  ASSESSMENT AND PLAN: *32 year old male with GERD, epigastric pain, and some dysphagia:  Symptoms present for 3 years but has not really tried much in the way of medication.  Will place him on PPI 40 mg daily.  Will check a barium esophagram to start.   CC:  No ref. provider found

## 2017-09-18 NOTE — Progress Notes (Signed)
Thank you for sending this case to me. I have reviewed the entire note, and the outlined plan seems appropriate.   Laryssa Hassing Danis, MD  

## 2017-09-25 ENCOUNTER — Ambulatory Visit (HOSPITAL_COMMUNITY): Admission: RE | Admit: 2017-09-25 | Payer: Self-pay | Source: Ambulatory Visit

## 2017-10-05 ENCOUNTER — Emergency Department (HOSPITAL_COMMUNITY): Payer: Self-pay

## 2017-10-05 ENCOUNTER — Other Ambulatory Visit: Payer: Self-pay

## 2017-10-05 ENCOUNTER — Emergency Department (HOSPITAL_COMMUNITY)
Admission: EM | Admit: 2017-10-05 | Discharge: 2017-10-05 | Disposition: A | Discharge status: Home / Self Care | Payer: Self-pay | Attending: Emergency Medicine | Admitting: Emergency Medicine

## 2017-10-05 ENCOUNTER — Encounter (HOSPITAL_COMMUNITY): Payer: Self-pay | Admitting: Emergency Medicine

## 2017-10-05 DIAGNOSIS — H538 Other visual disturbances: Secondary | ICD-10-CM | POA: Insufficient documentation

## 2017-10-05 DIAGNOSIS — R51 Headache: Secondary | ICD-10-CM | POA: Insufficient documentation

## 2017-10-05 DIAGNOSIS — R200 Anesthesia of skin: Secondary | ICD-10-CM | POA: Insufficient documentation

## 2017-10-05 DIAGNOSIS — R519 Headache, unspecified: Secondary | ICD-10-CM

## 2017-10-05 DIAGNOSIS — Z87891 Personal history of nicotine dependence: Secondary | ICD-10-CM | POA: Insufficient documentation

## 2017-10-05 LAB — CBC WITH DIFFERENTIAL/PLATELET
BASOS PCT: 0 %
Basophils Absolute: 0 10*3/uL (ref 0.0–0.1)
Eosinophils Absolute: 0.2 10*3/uL (ref 0.0–0.7)
Eosinophils Relative: 4 %
HEMATOCRIT: 43.5 % (ref 39.0–52.0)
HEMOGLOBIN: 15.2 g/dL (ref 13.0–17.0)
LYMPHS ABS: 2.4 10*3/uL (ref 0.7–4.0)
Lymphocytes Relative: 39 %
MCH: 28.7 pg (ref 26.0–34.0)
MCHC: 34.9 g/dL (ref 30.0–36.0)
MCV: 82.2 fL (ref 78.0–100.0)
MONO ABS: 0.6 10*3/uL (ref 0.1–1.0)
MONOS PCT: 9 %
NEUTROS ABS: 3 10*3/uL (ref 1.7–7.7)
Neutrophils Relative %: 48 %
Platelets: 242 10*3/uL (ref 150–400)
RBC: 5.29 MIL/uL (ref 4.22–5.81)
RDW: 12.8 % (ref 11.5–15.5)
WBC: 6.3 10*3/uL (ref 4.0–10.5)

## 2017-10-05 LAB — BASIC METABOLIC PANEL
Anion gap: 12 (ref 5–15)
BUN: 12 mg/dL (ref 6–20)
CALCIUM: 9.1 mg/dL (ref 8.9–10.3)
CHLORIDE: 104 mmol/L (ref 101–111)
CO2: 22 mmol/L (ref 22–32)
CREATININE: 0.92 mg/dL (ref 0.61–1.24)
GFR calc non Af Amer: >60 mL/min (ref >60)
Glucose, Bld: 101 mg/dL — ABNORMAL HIGH (ref 65–99)
Potassium: 3.5 mmol/L (ref 3.5–5.1)
Sodium: 138 mmol/L (ref 135–145)

## 2017-10-05 NOTE — ED Provider Notes (Signed)
[facility] EMERGENCY DEPARTMENT Provider Note   CSN: [id number] Arrival date & time: 10/05/17  1158     History   Chief Complaint Chief Complaint  Patient presents with  . Migraine  . Blurred Vision    HPI Thomas Payne is a 32 y.o. male.  The history is provided by the patient. A language interpreter was used.  Migraine    Thomas Payne is a 32 y.o. male who presents to the Emergency Department complaining of headache.  He reports constant throbbing headache for the last month.  He initially began having headaches that are similar 3 years ago but they were waxing and waning at that time.  For the last month they have been constant and daily with no clear alleviating or worsening factors.  He denies any fevers, nausea, vomiting, and weakness.  He has numbness throughout his head at times.  He has occasionally mildly blurred vision.  No night sweats or weight loss.  He has taken Excedrin Migraine and that does resolve his headache.  History reviewed. No pertinent past medical history.  Patient Active Problem List   Diagnosis Date Noted  . Gastroesophageal reflux disease 09/17/2017  . Abdominal pain, epigastric 09/17/2017  . Dysphagia 09/17/2017    Past Surgical History:  Procedure Laterality Date  . CYST REMOVAL HAND Left        Home Medications    Prior to Admission medications   Medication Sig Start Date End Date Taking? Authorizing Provider  ibuprofen (ADVIL,MOTRIN) 600 MG tablet Take 1 tablet (600 mg total) by mouth every 8 (eight) hours as needed. With food 06/03/17   Anders Simmonds, PA-C  ranitidine (ZANTAC) 150 MG tablet Take 150 mg by mouth as needed for heartburn.    [provider]    Family History History reviewed. No pertinent family history.  Social History Social History   Tobacco Use  . Smoking status: Former Smoker    Last attempt to quit: 09/17/2012    Years since quitting: 5.0  . Smokeless  tobacco: Never Used  Substance Use Topics  . Alcohol use: No  . Drug use: No     Allergies   Patient has no known allergies.   Review of Systems Review of Systems  All other systems reviewed and are negative.    Physical Exam Updated Vital Signs BP 110/62 (BP Location: Right Arm)   Pulse (!) 59   Temp 98.2 F (36.8 C) (Oral)   Resp 16   SpO2 98%   Physical Exam  Constitutional: He is oriented to person, place, and time. He appears well-developed and well-nourished.  HENT:  Head: Normocephalic and atraumatic.  Right Ear: External ear normal.  Left Ear: External ear normal.  Mouth/Throat: No oropharyngeal exudate.  Eyes: Conjunctivae and EOM are normal. Pupils are equal, round, and reactive to light.  Neck: Neck supple.  Cardiovascular: Normal rate and regular rhythm.  No murmur heard. Pulmonary/Chest: Effort normal and breath sounds normal. No respiratory distress.  Abdominal: Soft. There is no tenderness. There is no rebound and no guarding.  Musculoskeletal: He exhibits no edema or tenderness.  Neurological: He is alert and oriented to person, place, and time. No cranial nerve deficit. Coordination normal.  5 out of 5 strength in all 4 extremities with sensation light touch intact in all 4 extremities  Skin: Skin is warm and dry.  Psychiatric: He has a normal mood and affect. His behavior is normal.  Nursing note and vitals  reviewed.    ED Treatments / Results  Labs (all labs ordered are listed, but only abnormal results are displayed) Labs Reviewed  BASIC METABOLIC PANEL - Abnormal; Notable for the following components:      Result Value   Glucose, Bld 101 (*)    All other components within normal limits  CBC WITH DIFFERENTIAL/PLATELET    EKG  EKG Interpretation None       Radiology Ct Head Wo Contrast  Result Date: 10/05/2017 CLINICAL DATA:  Headache for 1 month sometimes hurts in Rt temple area then across forehead. Has nausea with headache  sometimes but no vomiting EXAM: CT HEAD WITHOUT CONTRAST TECHNIQUE: Contiguous axial images were obtained from the base of the skull through the vertex without intravenous contrast. COMPARISON:  None. FINDINGS: Brain: No evidence of acute infarction, hemorrhage, hydrocephalus, extra-axial collection or mass lesion/mass effect. Vascular: No hyperdense vessel or unexpected calcification. Skull: Normal. Negative for fracture or focal lesion. Sinuses/Orbits: No acute finding. Other: None. IMPRESSION: 1. Negative for bleed or other acute intracranial process. Electronically Signed   By: Corlis Leak  Hassell M.D.   On: 10/05/2017 18:30    Procedures Procedures (including critical care time)  Medications Ordered in ED Medications - No data to display   Initial Impression / Assessment and Plan / ED Course  I have reviewed the triage vital signs and the nursing notes.  Pertinent labs & imaging results that were available during my care of the patient were reviewed by me and considered in my medical decision making (see chart for details).     Patient presents for evaluation of headache that is been ongoing for the last month.  He is nontoxic appearing on examination with no focal neurologic deficits.  Presentation is not consistent with subarachnoid hemorrhage, dural sinus thrombosis, temporal arteritis, meningitis.  His headache is resolved in the emergency department after having taken Excedrin Migraine prior to ED arrival.  Discussed with patient home care for headache with outpatient follow-up and return precautions.  Patient denies any carbon monoxide exposure. Final Clinical Impressions(s) / ED Diagnoses   Final diagnoses:  Bad headache    ED Discharge Orders    None       Tilden Fossaees, Vennie Waymire, MD 10/05/17 [phone]

## 2017-10-05 NOTE — ED Triage Notes (Signed)
Pt speaks spanish, states 1 month of constant migraine, pain to right front of head radiating to posterior head. Denies neck pain. Afebrile. Denies medical hx. Pt also states pain to eyes with blurry vision to both eyes for 1 month.

## 2017-10-05 NOTE — ED Notes (Signed)
Patient transported to CT 

## 2017-10-05 NOTE — ED Notes (Signed)
ED Provider at bedside. 

## 2018-03-17 ENCOUNTER — Telehealth: Payer: Self-pay | Admitting: Gastroenterology

## 2018-03-17 NOTE — Telephone Encounter (Signed)
The pt will call in to reschedule office appt to discuss further testing.

## 2018-03-17 NOTE — Telephone Encounter (Signed)
Appt scheduled on 04/06/18 at 9:00 am .

## 2018-04-06 ENCOUNTER — Encounter: Payer: Self-pay | Admitting: Gastroenterology

## 2018-04-06 ENCOUNTER — Encounter (INDEPENDENT_AMBULATORY_CARE_PROVIDER_SITE_OTHER): Payer: Self-pay

## 2018-04-06 ENCOUNTER — Ambulatory Visit (INDEPENDENT_AMBULATORY_CARE_PROVIDER_SITE_OTHER): Payer: Self-pay | Admitting: Gastroenterology

## 2018-04-06 VITALS — BP 100/64 | HR 52 | Ht 61.0 in | Wt 170.5 lb

## 2018-04-06 DIAGNOSIS — R1310 Dysphagia, unspecified: Secondary | ICD-10-CM

## 2018-04-06 DIAGNOSIS — K219 Gastro-esophageal reflux disease without esophagitis: Secondary | ICD-10-CM

## 2018-04-06 NOTE — Progress Notes (Signed)
04/06/2018 Thomas Payne [birth year]   HISTORY OF PRESENT ILLNESS:  This is a 32 year old male who was seen here in January of this year.  He is primarily Spanish-speaking so interpretation was provided by a member of our office staff.  He presents to the office today with ongoing complaints of GERD and dysphagia.  He had these same symptoms when he was here in January but had only been using zantac prn for his symptoms so he was started on pantoprazole 40 mg daily and was scheduled for esophagram.  He never had the esophagram performed, but has apparently been taking the pantoprazole daily and also using zantac prn.  Apparently no improvement on the medications.  Symptoms have been present for about the past 3.5 years or so at this point.  He says that solid food like meat seems to get stuck and he has to drink a lot and keep swallowing to get it to pass.  When he drinks soda he feels like that is hard to go down as well.  No weight loss.    Past Medical History:  Diagnosis Date  . GERD (gastroesophageal reflux disease)    Past Surgical History:  Procedure Laterality Date  . CYST REMOVAL HAND Left     reports that he quit smoking about 5 years ago. He has never used smokeless tobacco. He reports that he does not drink alcohol or use drugs. family history is not on file. No Known Allergies    Outpatient Encounter Medications as of 04/06/2018  Medication Sig  . ibuprofen (ADVIL,MOTRIN) 600 MG tablet Take 1 tablet (600 mg total) by mouth every 8 (eight) hours as needed. With food (Patient taking differently: Take 600 mg by mouth as needed. With food)  . pantoprazole (PROTONIX) 40 MG tablet Take 1 tablet by mouth daily.  . ranitidine (ZANTAC) 150 MG tablet Take 150 mg by mouth as needed for heartburn.   No facility-administered encounter medications on file as of 04/06/2018.      REVIEW OF SYSTEMS  : All other systems reviewed and negative except where noted in the  History of Present Illness.   PHYSICAL EXAM: BP 100/64 (BP Location: Left Arm, Patient Position: Sitting, Cuff Size: Normal)   Pulse (!) 52   Ht 5\' 1"  (1.549 m)   Wt 170 lb 8 oz (77.3 kg)   BMI 32.22 kg/m  General: Well developed Hispanic male in no acute distress Head: Normocephalic and atraumatic Eyes:  Sclerae anicteric, conjunctiva pink. Ears: Normal auditory acuity Lungs: Clear throughout to auscultation; no increased WOB. Heart: Regular rate and rhythm; no M/R/G. Abdomen: Soft, non-distended.  BS present.  Non-tender. Musculoskeletal: Symmetrical with no gross deformities  Skin: No lesions on visible extremities Extremities: No edema  Neurological: Alert oriented x 4, grossly non-focal Psychological:  Alert and cooperative. Normal mood and affect  ASSESSMENT AND PLAN: *32 year old male with GERD and dysphagia:  Symptoms present for 3 years.  Was seen in 09/2017 and esophagram was ordered but he did not have it performed.  Was also started on pantoprazole 40 mg daily, but does not feel that it has helped at all.  Will still plan for esophagram first but will also plan for EGD out a few weeks as well as I have a feeling esophagram may show a stricture.  **The risks, benefits, and alternatives to EGD with dilation were discussed with the patient and he consents to proceed.   CC:  No ref. provider  found

## 2018-04-06 NOTE — Patient Instructions (Addendum)
You have been scheduled for an endoscopy. Please follow written instructions given to you at your visit today. If you use inhalers (even only as needed), please bring them with you on the day of your procedure. Your physician has requested that you go to www.startemmi.com and enter the access code given to you at your visit today. This web site gives a general overview about your procedure. However, you should still follow specific instructions given to you by our office regarding your preparation for the procedure.  You have been scheduled for a [facility]1st floor of the hospital) on 04/08/18 at 9:30 am. Please arrive 15 minutes prior to your appointment for registration. Make certain not to have anything to eat or drink 6 hours prior to your test. If you need to reschedule for any reason, please contact radiology at [phone] to do so. __________________________________________________________________ A barium swallow is an examination that concentrates on views of the esophagus. This tends to be a double contrast exam (barium and two liquids which, when combined, create a gas to distend the wall of the oesophagus) or single contrast (non-ionic iodine based). The study is usually tailored to your symptoms so a good history is essential. Attention is paid during the study to the form, structure and configuration of the esophagus, looking for functional disorders (such as aspiration, dysphagia, achalasia, motility and reflux) EXAMINATION You may be asked to change into a gown, depending on the type of swallow being performed. A radiologist and radiographer will perform the procedure. The radiologist will advise you of the type of contrast selected for your procedure and direct you during the exam. You will be asked to stand, sit or lie in several different positions and to hold a small amount of fluid in your mouth before being asked to swallow while the imaging is  performed .In some instances you may be asked to swallow barium coated marshmallows to assess the motility of a solid food bolus. The exam can be recorded as a digital or video fluoroscopy procedure. POST PROCEDURE It will take 1-2 days for the barium to pass through your system. To facilitate this, it is important, unless otherwise directed, to increase your fluids for the next 24-48hrs and to resume your normal diet.  This test typically takes about 30 minutes to perform. __________________________________________________________________________________

## 2018-04-06 NOTE — Progress Notes (Signed)
Thank you for sending this case to me. I have reviewed the entire note.  I agree with the EGD, but do not feel the barium esophogram is necessary.  Even if no stricture seen, he would still need the EGD for EoE and other Dx perhaps not evident on such an imaging study. Please cancel barium study.   Amada JupiterHenry Danis, MD

## 2018-04-06 NOTE — Progress Notes (Signed)
Barium swallow canceled. Thomas Payne will call patient and inform him. He knows to only come to the endoscopy.

## 2018-04-08 ENCOUNTER — Ambulatory Visit (HOSPITAL_COMMUNITY): Payer: Self-pay

## 2018-04-22 ENCOUNTER — Encounter: Payer: Self-pay | Admitting: Gastroenterology

## 2018-04-22 ENCOUNTER — Ambulatory Visit (AMBULATORY_SURGERY_CENTER): Payer: Self-pay | Admitting: Gastroenterology

## 2018-04-22 VITALS — BP 101/65 | HR 60 | Temp 98.4°F | Resp 18 | Ht 61.0 in | Wt 170.0 lb

## 2018-04-22 DIAGNOSIS — R1310 Dysphagia, unspecified: Secondary | ICD-10-CM

## 2018-04-22 DIAGNOSIS — R12 Heartburn: Secondary | ICD-10-CM

## 2018-04-22 DIAGNOSIS — R140 Abdominal distension (gaseous): Secondary | ICD-10-CM

## 2018-04-22 DIAGNOSIS — R1011 Right upper quadrant pain: Secondary | ICD-10-CM

## 2018-04-22 DIAGNOSIS — K2950 Unspecified chronic gastritis without bleeding: Secondary | ICD-10-CM

## 2018-04-22 MED ORDER — SODIUM CHLORIDE 0.9 % IV SOLN: 500.0000 mL | Freq: Once | INTRAVENOUS | Status: DC

## 2018-04-22 NOTE — Op Note (Signed)
[facility] Patient Name: Thomas Payne Procedure Date: 04/22/2018 9:51 AM MRN: [id number] Endoscopist: Sherilyn Cooter L. Myrtie Neither , MD Age: 32 Referring MD:  Date of Birth: Jan 29, 1986 Gender: Male Account #: [id number] Procedure:                Upper GI endoscopy Indications:              Dysphagia, Heartburn, Abdominal bloating Medicines:                Monitored Anesthesia Care Procedure:                Pre-Anesthesia Assessment:                           - Prior to the procedure, a History and Physical                            was performed, and patient medications and                            allergies were reviewed. The patient's tolerance of                            previous anesthesia was also reviewed. The risks                            and benefits of the procedure and the sedation                            options and risks were discussed with the patient.                            All questions were answered, and informed consent                            was obtained. Prior Anticoagulants: The patient has                            taken no previous anticoagulant or antiplatelet                            agents. ASA Grade Assessment: II - A patient with                            mild systemic disease. After reviewing the risks                            and benefits, the patient was deemed in                            satisfactory condition to undergo the procedure.                           After obtaining informed consent, the endoscope was  passed under direct vision. Throughout the                            procedure, the patient's blood pressure, pulse, and                            oxygen saturations were monitored continuously. The                            Endoscope was introduced through the mouth, and                            advanced to the second part of duodenum. The upper                            GI  endoscopy was accomplished without difficulty.                            The patient tolerated the procedure well. Scope In: Scope Out: Findings:                 The larynx was normal.                           The esophagus was normal. Specifically, no                            stricture, mass, mucosal abnormalities, dilatation                            or resistance passing the scope through the EGJ.                            Motility was seen.                           The entire examined stomach was normal. Biopsies                            were taken with a cold forceps for histology.                            (Sidney protocol).                           The cardia and gastric fundus were normal on                            retroflexion.                           The examined duodenum was normal. Complications:            No immediate complications. Estimated Blood Loss:     Estimated blood loss was minimal. Impression:               - Normal larynx.                           -  Normal esophagus.                           - Normal stomach. Biopsied.                           - Normal examined duodenum. Recommendation:           - Patient has a contact number available for                            emergencies. The signs and symptoms of potential                            delayed complications were discussed with the                            patient. Return to normal activities tomorrow.                            Written discharge instructions were provided to the                            patient.                           - Resume previous diet.                           - Continue present medications if helping                            heartburn. If not, they may be discontinued.                           - Await pathology results. Calaya Gildner L. Myrtie Neither, MD 04/22/2018 10:12:33 AM This report has been signed electronically.

## 2018-04-22 NOTE — Patient Instructions (Addendum)
YOU HAD AN ENDOSCOPIC PROCEDURE TODAY AT THE [facility]:   Refer to the procedure report that was given to you for any specific questions about what was found during the examination.  If the procedure report does not answer your questions, please call your gastroenterologist to clarify.  If you requested that your care partner not be given the details of your procedure findings, then the procedure report has been included in a sealed envelope for you to review at your convenience later.  YOU SHOULD EXPECT: Some feelings of bloating in the abdomen. Passage of more gas than usual.  Walking can help get rid of the air that was put into your GI tract during the procedure and reduce the bloating. If you had a lower endoscopy (such as a colonoscopy or flexible sigmoidoscopy) you may notice spotting of blood in your stool or on the toilet paper. If you underwent a bowel prep for your procedure, you may not have a normal bowel movement for a few days.  Please Note:  You might notice some irritation and congestion in your nose or some drainage.  This is from the oxygen used during your procedure.  There is no need for concern and it should clear up in a day or so.  SYMPTOMS TO REPORT IMMEDIATELY:    Following upper endoscopy (EGD)  Vomiting of blood or coffee ground material  New chest pain or pain under the shoulder blades  Painful or persistently difficult swallowing  New shortness of breath  Fever of 100F or higher  Black, tarry-looking stools  For urgent or emergent issues, a gastroenterologist can be reached at any hour by calling [phone].   DIET:  We do recommend a small meal at first, but then you may proceed to your regular diet.  Drink plenty of fluids but you should avoid alcoholic beverages for 24 hours.  MEDICATIONS: Continue present medications.  Please see handouts given to you by your recovery nurse.  ACTIVITY:  You should plan to take it easy for the rest of  today and you should NOT DRIVE or use heavy machinery until tomorrow (because of the sedation medicines used during the test).    FOLLOW UP: Our staff will call the number listed on your records the next business day following your procedure to check on you and address any questions or concerns that you may have regarding the information given to you following your procedure. If we do not reach you, we will leave a message.  However, if you are feeling well and you are not experiencing any problems, there is no need to return our call.  We will assume that you have returned to your regular daily activities without incident.  If any biopsies were taken you will be contacted by phone or by letter within the next 1-3 weeks.  Please call us at [phone] if you have not heard about the biopsies in 3 weeks.   Thank you for allowing [place] to provide for your healthcare needs today. SIGNATURES/CONFIDENTIALITY: You and/or your care partner have signed paperwork which will be entered into your electronic medical record.  These signatures attest to the fact that that the information above on your After Visit Summary has been reviewed and is understood.  Full responsibility of the confidentiality of this discharge information lies with you and/or your care-partner.USTED TUVO UN PROCEDIMIENTO ENDOSCPICO HOY EN [facility]:   Lea el informe del procedimiento que se le entreg para cualquier pregunta  especfica sobre lo que se Dentistencontr durante su examen.  Si el informe del examen no responde a sus preguntas, por favor llame a su gastroenterlogo para aclararlo.  Si usted solicit que no se le den Lowe's Companiesdetalles de lo que se Clinical cytogeneticistencontr en su procedimiento al Marathon Oilacompaante que le va a cuidar, entonces el informe del procedimiento se ha incluido en un sobre sellado para que usted lo revise despus cuando le sea ms conveniente.   LO QUE PUEDE ESPERAR: Algunas sensaciones de hinchazn en el abdomen.  Puede tener  ms gases de lo normal.  El caminar puede ayudarle a eliminar el aire que se le puso en el tracto gastrointestinal durante el procedimiento y reducir la hinchazn.  Si le hicieron una endoscopia inferior (como una colonoscopia o una sigmoidoscopia flexible), podra notar manchas de sangre en las heces fecales o en el papel higinico.  Si se someti a una preparacin intestinal para su procedimiento, es posible que no tenga una evacuacin intestinal normal durante Time Warneralgunos das.   Tenga en cuenta:  Es posible que note un poco de irritacin y congestin en la nariz o algn drenaje.  Esto es debido al oxgeno Applied Materialsutilizado durante su procedimiento.  No hay que preocuparse y esto debe desaparecer ms o Regulatory affairs officermenos en un da.   SNTOMAS PARA REPORTAR INMEDIATAMENTE:  Despus de una endoscopia inferior (colonoscopia o sigmoidoscopia flexible):  Cantidades excesivas de sangre en las heces fecales  Sensibilidad significativa o empeoramiento de los dolores abdominales   Hinchazn aguda del abdomen que antes no tena   Fiebre de 100F o ms   Despus de la endoscopia superior (EGD)  Vmitos de Retail buyersangre o material como caf molido   Dolor en el pecho o dolor debajo de los omplatos que antes no tena   Dolor o dificultad persistente para tragar  Falta de aire que antes no tena   Fiebre de 100F o ms  Heces fecales negras y pegajosas   Para asuntos urgentes o de Associate Professoremergencia, puede comunicarse con un gastroenterlogo a cualquier hora llamando al [phone].  DIETA:  Recomendamos una comida pequea al principio, pero luego puede continuar con su dieta normal.  Tome muchos lquidos, Tax adviserpero debe evitar las bebidas alcohlicas durante 24 horas.    ACTIVIDAD:  Debe planear tomarse las cosas con calma por el resto del da y no debe CONDUCIR ni usar maquinaria pesada Patent examinerhasta maana (debido a los medicamentos de sedacin utilizados durante el examen).     SEGUIMIENTO: Nuestro personal llamar al nmero que aparece en su  historial al siguiente da hbil de su procedimiento para ver cmo se siente y para responder cualquier pregunta o inquietud que pueda tener con respecto a la informacin que se le dio despus del procedimiento. Si no podemos contactarle, le dejaremos un mensaje.  Sin embargo, si se siente bien y no tiene English as a second language teacherningn problema, no es necesario que nos devuelva la llamada.  Asumiremos que ha regresado a sus actividades diarias normales sin incidentes. Si se le tomaron algunas biopsias, le contactaremos por telfono o por carta en las prximas 3 semanas.  Si no ha sabido Walgreennada sobre las biopsias en el transcurso de 3 semanas, por favor llmenos al [phone].   FIRMAS/CONFIDENCIALIDAD: Usted y/o el acompaante que le cuide han firmado documentos que se ingresarn en su historial mdico electrnico.  Estas firmas atestiguan el hecho de que la informacin anterior

## 2018-04-22 NOTE — Progress Notes (Signed)
Report to PACU, RN, vss, BBS= Clear.  

## 2018-04-22 NOTE — Progress Notes (Signed)
Called to room to assist during endoscopic procedure.  Patient ID and intended procedure confirmed with present staff. Received instructions for my participation in the procedure from the performing physician.  

## 2018-04-23 ENCOUNTER — Telehealth: Payer: Self-pay | Admitting: *Deleted

## 2018-04-23 NOTE — Telephone Encounter (Signed)
  Follow up Call-  Call back number 04/22/2018  Post procedure Call Back phone  # [phone]  Permission to leave phone message Yes  Some recent data might be hidden     Patient questions:  Do you have a fever, pain , or abdominal swelling? No. Pain Score  0 *  Have you tolerated food without any problems? Yes.    Have you been able to return to your normal activities? Yes.    Do you have any questions about your discharge instructions: Diet   No. Medications  No. Follow up visit  No.  Do you have questions or concerns about your Care? No.  Actions: * If pain score is 4 or above: No action needed, pain <4.

## 2018-04-23 NOTE — Telephone Encounter (Signed)
  Follow up Call-  Call back number 04/22/2018  Post procedure Call Back phone  # [phone]  Permission to leave phone message Yes  Some recent data might be hidden     Patient questions:  Message left to call us if necessary.

## 2018-05-27 ENCOUNTER — Ambulatory Visit: Payer: Self-pay | Admitting: Gastroenterology

## 2020-01-18 ENCOUNTER — Ambulatory Visit: Payer: Self-pay | Admitting: Gastroenterology

## 2020-01-18 ENCOUNTER — Encounter: Payer: Self-pay | Admitting: Gastroenterology

## 2020-01-18 VITALS — BP 124/82 | HR 64 | Temp 98.7°F | Ht 62.0 in | Wt 174.8 lb

## 2020-01-18 DIAGNOSIS — R12 Heartburn: Secondary | ICD-10-CM

## 2020-01-18 DIAGNOSIS — K219 Gastro-esophageal reflux disease without esophagitis: Secondary | ICD-10-CM

## 2020-01-18 DIAGNOSIS — R1013 Epigastric pain: Secondary | ICD-10-CM

## 2020-01-18 DIAGNOSIS — K5900 Constipation, unspecified: Secondary | ICD-10-CM

## 2020-01-18 MED ORDER — FAMOTIDINE 40 MG PO TABS: 40.0000 mg | ORAL_TABLET | Freq: Two times a day (BID) | ORAL | 5 refills | Status: DC

## 2020-01-18 MED FILL — FAMOTIDINE 40 MG TABS: 40 | 30 days supply | Qty: 60 | Fill #0

## 2020-01-18 NOTE — Patient Instructions (Signed)
If you are age 35 or older, your body mass index should be between 23-30. Your Body mass index is 31.97 kg/m. If this is out of the aforementioned range listed, please consider follow up with your Primary Care Provider.  If you are age 43 or younger, your body mass index should be between 19-25. Your Body mass index is 31.97 kg/m. If this is out of the aformentioned range listed, please consider follow up with your Primary Care Provider.    You have been scheduled for a CT scan of the abdomen and pelvis at [facility] ([address]---this is in the same building as [employer]).   You are scheduled on Thursday, Jan 26, 2020 at 1:30pm. You should arrive 15 minutes prior to your appointment time for registration. Please follow the written instructions below on the day of your exam:  WARNING: IF YOU ARE ALLERGIC TO IODINE/X-RAY DYE, PLEASE NOTIFY RADIOLOGY IMMEDIATELY AT [phone]! YOU WILL BE GIVEN A 13 HOUR PREMEDICATION PREP.  1) Do not eat anything after 9:30am (4 hours prior to your test) 2) You have been given 2 bottles of oral contrast to drink. The solution may taste better if refrigerated, but do NOT add ice or any other liquid to this solution. Shake well before drinking.    Drink 1 bottle of contrast @ 11:30am (2 hours prior to your exam)  Drink 1 bottle of contrast @ 12:30pm (1 hour prior to your exam)  You may take any medications as prescribed with a small amount of water, if necessary. If you take any of the following medications: METFORMIN, GLUCOPHAGE, GLUCOVANCE, AVANDAMET, RIOMET, FORTAMET, [place] MET, JANUMET, GLUMETZA or METAGLIP, you MAY be asked to HOLD this medication 48 hours AFTER the exam.  The purpose of you drinking the oral contrast is to aid in the visualization of your intestinal tract. The contrast solution may cause some diarrhea. Depending on your individual set of symptoms, you may also receive an intravenous injection of x-ray  contrast/dye. Plan on being at [facility] for 30 minutes or longer, depending on the type of exam you are having performed.  This test typically takes 30-45 minutes to complete.  If you have any questions regarding your exam or if you need to reschedule, you may call the CT department at [phone] between the hours of 8:00 am and 5:00 pm, Monday-Friday.  _____________________________________________________________________  We have sent the following medications to your pharmacy for you to pick up at your convenience: Pepcid 40 mg: Take twice a day  Increase water and fluids, fiber (fruits and vegetables/salads, etc)  Please purchase the following medications over the counter and take as directed: Consider adding Miralax or Benefiber, as directed, daily.  Thank you for entrusting me with your care and for choosing [employer], Thomas Payne, P.A. - C.

## 2020-01-18 NOTE — Progress Notes (Signed)
01/18/2020 Thomas Payne [id number] [birth year]   HISTORY OF PRESENT ILLNESS: This is a 34 year old male who is a patient of Dr. Corena Pilgrim.  He has been seen here previously on a few occasions for reflux related symptoms.  Underwent EGD in August 2019 at which time the study was normal and gastric biopsies were negative for H. pylori.  He is here again today with complaints of acid reflux related symptoms and epigastric abdominal pain.  Is not on any medication for acid reflux at this time, but admits to taking occasional Alka-Seltzer.  Previously was prescribed pantoprazole 40 mg daily, but he tells me that he does not feel like that really helped his symptoms.  Also reports constipation.  Says that he moves his bowels once or twice every day, but does not feel like he is completely emptied.  Admits that he does not drink much water/fluids or eat much in the way of fruits and vegetables.  Patient is primarily Spanish-speaking so the entirety of the visit today was performed via interpreter/translator.   Past Medical History:  Diagnosis Date  . GERD (gastroesophageal reflux disease)    Past Surgical History:  Procedure Laterality Date  . CYST REMOVAL HAND Left     reports that he quit smoking about 7 years ago. He has never used smokeless tobacco. He reports that he does not drink alcohol or use drugs. family history is not on file. No Known Allergies    Outpatient Encounter Medications as of 01/18/2020  Medication Sig  . [DISCONTINUED] ibuprofen (ADVIL,MOTRIN) 600 MG tablet Take 1 tablet (600 mg total) by mouth every 8 (eight) hours as needed. With food (Patient taking differently: Take 600 mg by mouth as needed. With food)  . [DISCONTINUED] pantoprazole (PROTONIX) 40 MG tablet Take 1 tablet by mouth daily.  . [DISCONTINUED] ranitidine (ZANTAC) 150 MG tablet Take 150 mg by mouth as needed for heartburn.  . [DISCONTINUED] 0.9 %  sodium chloride infusion    No  facility-administered encounter medications on file as of 01/18/2020.     REVIEW OF SYSTEMS  : All other systems reviewed and negative except where noted in the History of Present Illness.   PHYSICAL EXAM: BP 124/82   Pulse 64   Temp 98.7 F (37.1 C)   Ht 5\' 2"  (1.575 m)   Wt 174 lb 12.8 oz (79.3 kg)   SpO2 98%   BMI 31.97 kg/m  General: Well developed Hispanic male in no acute distress Head: Normocephalic and atraumatic Eyes:  Sclerae anicteric, conjunctiva pink. Ears: Normal auditory acuity Lungs: Clear throughout to auscultation; no increased WOB. Heart: Regular rate and rhythm; no M/R/G. Abdomen: Soft, non-distended.  BS present.  Diffuse TTP. Musculoskeletal: Symmetrical with no gross deformities  Skin: No lesions on visible extremities Extremities: No edema  Neurological: Alert oriented x 4, grossly non-focal Psychological:  Alert and cooperative. Normal mood and affect  ASSESSMENT AND PLAN: *GERD and epigastric abdominal pain: Previously evaluated with EGD that was unremarkable.  Was given pantoprazole 40 mg daily, but he says he does not think that it helped.  Is not on any medication at this time.  Will just try Pepcid 40 mg twice daily for now.  Prescription sent to pharmacy. *Constipation: Describes having 2 bowel movements daily, but does not feel completely empty.  Drinks very little water/fluid eats very little fruits and vegetables.  Advised to increase his intake of these items.  Otherwise we discussed adding Benefiber or MiraLAX to his daily  regimen. *Generalized abdominal pain: General tenderness to palpation on today's exam.  We will plan for CT scan of the abdomen and pelvis with contrast.   CC:  No ref. provider found

## 2020-01-20 NOTE — Progress Notes (Signed)
____________________________________________________________  Attending physician addendum:  Thank you for sending this case to me. I have reviewed the entire note, and the outlined plan seems appropriate.  CT scan is reasonable with generalized pain.  At the time of my last exam, I felt these were functional GI symptoms.  Amada Jupiter, MD  ____________________________________________________________

## 2020-01-26 ENCOUNTER — Ambulatory Visit (INDEPENDENT_AMBULATORY_CARE_PROVIDER_SITE_OTHER)
Admission: RE | Admit: 2020-01-26 | Discharge: 2020-01-26 | Disposition: A | Discharge status: Home / Self Care | Payer: Self-pay | Source: Ambulatory Visit | Attending: Gastroenterology | Admitting: Gastroenterology

## 2020-01-26 ENCOUNTER — Other Ambulatory Visit: Payer: Self-pay

## 2020-01-26 DIAGNOSIS — R1013 Epigastric pain: Secondary | ICD-10-CM

## 2020-01-26 DIAGNOSIS — K5900 Constipation, unspecified: Secondary | ICD-10-CM

## 2020-01-26 DIAGNOSIS — K219 Gastro-esophageal reflux disease without esophagitis: Secondary | ICD-10-CM

## 2020-01-26 MED ORDER — IOHEXOL 300 MG/ML  SOLN
100.0000 mL | Freq: Once | INTRAMUSCULAR | Status: AC | PRN
  Administered 2020-01-26: 100 mL via INTRAVENOUS

## 2020-06-20 ENCOUNTER — Other Ambulatory Visit: Payer: Self-pay

## 2020-06-20 ENCOUNTER — Encounter: Payer: Self-pay | Admitting: Physical Therapy

## 2020-06-20 ENCOUNTER — Ambulatory Visit: Payer: Self-pay | Attending: Urology | Admitting: Physical Therapy

## 2020-06-20 DIAGNOSIS — R252 Cramp and spasm: Secondary | ICD-10-CM | POA: Insufficient documentation

## 2020-06-20 DIAGNOSIS — N50811 Right testicular pain: Secondary | ICD-10-CM | POA: Insufficient documentation

## 2020-06-20 DIAGNOSIS — M6281 Muscle weakness (generalized): Secondary | ICD-10-CM | POA: Insufficient documentation

## 2020-06-20 NOTE — Therapy (Signed)
[facility] [address] [place], [place], [address] Phone: [phone]   Fax:  [phone]  Physical Therapy Evaluation  Patient Details  Name: Thomas Payne MRN: [id number] Date of Birth: 1986-07-04 Referring Provider (PT): Dr. Berniece Salines   Encounter Date: 06/20/2020   PT End of Session - 06/20/20 1230    Visit Number 1    Date for PT Re-Evaluation 09/12/20    Authorization Type self pay    PT Start Time 1100    PT Stop Time 1140    PT Time Calculation (min) 40 min    Activity Tolerance Patient tolerated treatment well;No increased pain    Behavior During Therapy WFL for tasks assessed/performed           Past Medical History:  Diagnosis Date  . GERD (gastroesophageal reflux disease)     Past Surgical History:  Procedure Laterality Date  . CYST REMOVAL HAND Left     There were no vitals filed for this visit.    Subjective Assessment - 06/20/20 1106    Subjective Started 10 years ago the testicular pain. Patient works in [employer].    Patient is accompained by: Interpreter    Patient Stated Goals reduce pain    Currently in Pain? 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 Berniece Salines    Onset Date/Surgical Date --   chronic for 10 years   Prior Therapy none      Precautions   Precautions None      Restrictions   Weight Bearing Restrictions No      Balance  Screen   Has the patient fallen in the past 6 months No    Has the patient had a decrease in activity level because of a fear of falling?  No    Is the patient reluctant to leave their home because of a fear of falling?  No      Home Tourist information centre manager residence      Prior Function   Level of Independence Independent    Vocation Full time employment    Higher education careers adviser- Education administrator, carry heavy stuff, climb ladders    Leisure plays soccer and run; after plays soccer pain in right groin and right buttocks, main kicking leg is the right      Cognition   Overall Cognitive Status Within Functional Limits for tasks assessed      Posture/Postural Control   Posture/Postural Control No significant limitations      ROM / Strength   AROM / PROM / Strength AROM;PROM;Strength      AROM   Overall AROM Comments possible right hip shift    Lumbar Flexion full with tightness in lumbar    Lumbar Extension full with most movement  on the L2    Lumbar - Right Side Bend decreased by 25% with less c curve    Lumbar - Left Side Bend decreased by 25%    Lumbar - Right Rotation decreased by 25%      PROM   Right Hip Flexion 105    Right Hip External Rotation  40    Left Hip Flexion 115    Left Hip External Rotation  35      Strength   Right Hip ABduction 4-/5      Palpation   Spinal mobility Decreased movement of T8-T10, and L5    SI assessment  right ilium anteriorly rotated    Palpation comment right psoas, right obliques, left psoas, right levator ani, right gluteal                      Objective measurements completed on examination: See above findings.     Pelvic Floor Special Questions - 06/20/20 0001    Urinary Leakage No    Urinary frequency every hour and very little, does not feel like he completely empties his bladder    Fecal incontinence --   constipation and straiining                   PT Education - 06/20/20  1229    Education Details educated patient on how to use a tennis ball to massage the right levator ani and gluteal    Person(s) Educated Patient;Other (comment)   interpreter   Methods Explanation;Demonstration    Comprehension Returned demonstration;Verbalized understanding            PT Short Term Goals - 06/20/20 1404      PT SHORT TERM GOAL #1   Title independent with initial HEP    Time 4    Period Weeks    Status New    Target Date 07/18/20             PT Long Term Goals - 06/20/20 1405      PT LONG TERM GOAL #1   Title independent with advanced HEP    Time 12    Period Weeks    Status New    Target Date 09/12/20      PT LONG TERM GOAL #2   Title testicular pain during the day reduces >/= 75% due to improve tissue mobility and reduction of trigger points    Time 12    Period Weeks    Status New    Target Date 09/12/20      PT LONG TERM GOAL #3   Title after a game if soccer improved right groin and buttock pain decreased >/= 75% due to correct aignment of the pelvis and elongation fo tissue    Time 12    Period Weeks    Status New    Target Date 09/12/20      PT LONG TERM GOAL #4   Title pain with running decreased >/= 75% due to right hip abduction strength >/= 5/5    Time 12    Period Weeks    Status New    Target Date 09/12/20                  Plan - 06/20/20 1357    Clinical Impression Statement Patient is a 34 year old male with testicular pain that happens suddenly 10 years ago. Patient does not know what causes the pain but will get right  hip and groin pain with soccer and running. Patient pain level can get to a 10/10 intermittently. Decreased bilateral hip externl rotation and right hip flexion. Right hip flexion increases right lower abdominal pain. Patient has decreased movement of T5-T10 and L5. Right ilium is anteriorly rotated. Tenderness located in right psoas, right obliques, and right levator ani. Patient will benefit from  skilled therapy to work on mobility and reduce his pain.    Personal Factors and Comorbidities Profession    Examination-Activity Limitations Squat;Carry;Lift    Examination-Participation Restrictions Occupation    Stability/Clinical Decision Making Stable/Uncomplicated    Clinical Decision Making Low    Rehab Potential Excellent    PT Frequency 1x / week    PT Duration 12 weeks    PT Treatment/Interventions Biofeedback;Cryotherapy;Electrical Stimulation;Iontophoresis 4mg /ml Dexamethasone;Moist Heat;Ultrasound;Neuromuscular re-education;Therapeutic exercise;Therapeutic activities;Patient/family education;Manual techniques;Dry needling;Spinal Manipulations;Joint Manipulations    PT Next Visit Plan correct ilium, hip flexibility; mobilize the right hip, soft tissue work to the right hip, psoas, abdominals, inner thigh and levator ani, diaphragmatic breathing, right hip abductor strength    Consulted and Agree with Plan of Care Patient           Patient will benefit from skilled therapeutic intervention in order to improve the following deficits and impairments:  Decreased coordination, Increased fascial restricitons, Pain, Decreased strength, Decreased mobility, Impaired flexibility  Visit Diagnosis: Muscle weakness (generalized) - Plan: PT plan of care cert/re-cert  Cramp and spasm - Plan: PT plan of care cert/re-cert  Testicular pain, right - Plan: PT plan of care cert/re-cert     Problem List Patient Active Problem List   Diagnosis Date Noted  . Heartburn 01/18/2020  . Constipation 01/18/2020  . Gastroesophageal reflux disease 09/17/2017  . Abdominal pain, epigastric 09/17/2017  . Dysphagia 09/17/2017    11/15/2017, PT 06/20/20 2:11 PM   [facility] [address], [address] [place], [place], [place] Phone: [phone]   Fax:  [phone]  Name: Thomas Payne MRN: Henrietta Hoover Date of Birth: 24-May-1986

## 2020-06-28 ENCOUNTER — Other Ambulatory Visit: Payer: Self-pay

## 2020-06-28 ENCOUNTER — Ambulatory Visit: Payer: Self-pay | Admitting: Physical Therapy

## 2020-06-28 ENCOUNTER — Encounter: Payer: Self-pay | Admitting: Physical Therapy

## 2020-06-28 DIAGNOSIS — N50811 Right testicular pain: Secondary | ICD-10-CM

## 2020-06-28 DIAGNOSIS — M6281 Muscle weakness (generalized): Secondary | ICD-10-CM

## 2020-06-28 DIAGNOSIS — R252 Cramp and spasm: Secondary | ICD-10-CM

## 2020-06-28 NOTE — Therapy (Signed)
[facility] [address] [place], [place], [address] Phone: [phone]   Fax:  [phone]  Physical Therapy Treatment  Patient Details  Name: Thomas Payne MRN: [id number] Date of Birth: 22-Mar-1986 Referring Provider (PT): Dr. Berniece Salines   Encounter Date: 06/28/2020   PT End of Session - 06/28/20 1308    Visit Number 2    Date for PT Re-Evaluation 09/12/20    Authorization Type self pay    PT Start Time 1230    PT Stop Time 1308    PT Time Calculation (min) 38 min    Activity Tolerance Patient tolerated treatment well;No increased pain    Behavior During Therapy WFL for tasks assessed/performed           Past Medical History:  Diagnosis Date  . GERD (gastroesophageal reflux disease)     Past Surgical History:  Procedure Laterality Date  . CYST REMOVAL HAND Left     There were no vitals filed for this visit.   Subjective Assessment - 06/28/20 1235    Subjective No changes since the last time he was in therapy.    Patient Stated Goals reduce pain    Currently in Pain? Yes    Pain Score 4     Pain Location Penis   testicular   Pain Orientation Mid    Pain Descriptors / Indicators Squeezing;Tightness    Pain Type Chronic pain    Pain Onset More than a month ago    Pain Frequency Constant    Aggravating Factors  just comes the pain    Pain Relieving Factors medication (tylenol)    Multiple Pain Sites No                             [facility] Adult PT Treatment/Exercise - 06/28/20 0001      Lumbar Exercises: Stretches   Single Knee to Chest Stretch Right;Left;1 rep;30 seconds    Hip Flexor Stretch Right;1 rep;30 seconds    Hip Flexor Stretch Limitations 1/2 kneel    Press Ups 10 reps    Piriformis Stretch Right;Left;1 rep;30 seconds    Piriformis Stretch Limitations supine      Manual Therapy   Manual Therapy Joint mobilization;Soft tissue mobilization    Joint  Mobilization anterior mobilization to right hip grade 3; using the mulligan belt to mobilize the right hip for distraction, inferior glide; PA and rotational mobilization to T6-L5    Soft tissue mobilization soft tissue work to the right psoas; right levator ani in right sidely                  PT Education - 06/28/20 1307    Education Details Access Code: [email or web address]    Person(s) Educated Patient    Methods Explanation;Demonstration;Verbal cues;Handout    Comprehension Returned demonstration;Verbalized understanding            PT Short Term Goals - 06/20/20 1404      PT SHORT TERM GOAL #1   Title independent with initial HEP    Time 4    Period Weeks    Status New    Target Date 07/18/20             PT Long Term Goals - 06/20/20 1405      PT LONG TERM GOAL #1   Title independent with advanced HEP    Time 12    Period Weeks  Status New    Target Date 09/12/20      PT LONG TERM GOAL #2   Title testicular pain during the day reduces >/= 75% due to improve tissue mobility and reduction of trigger points    Time 12    Period Weeks    Status New    Target Date 09/12/20      PT LONG TERM GOAL #3   Title after a game if soccer improved right groin and buttock pain decreased >/= 75% due to correct aignment of the pelvis and elongation fo tissue    Time 12    Period Weeks    Status New    Target Date 09/12/20      PT LONG TERM GOAL #4   Title pain with running decreased >/= 75% due to right hip abduction strength >/= 5/5    Time 12    Period Weeks    Status New    Target Date 09/12/20                 Plan - 06/28/20 1311    Clinical Impression Statement Patient had no pain after therapy. He has a tight right hip for external rotation and hip extension. Patient had trigger point in the right psoas and levator ani. Patient has learned stretches to address his right hip and back. Patient will benefit from skilled therapy to work on mobility and reduce  his pain.    Personal Factors and Comorbidities Profession    Examination-Activity Limitations Squat;Carry;Lift    Examination-Participation Restrictions Occupation    Stability/Clinical Decision Making Stable/Uncomplicated    Rehab Potential Excellent    PT Frequency 1x / week    PT Duration 12 weeks    PT Treatment/Interventions Biofeedback;Cryotherapy;Electrical Stimulation;Iontophoresis 4mg /ml Dexamethasone;Moist Heat;Ultrasound;Neuromuscular re-education;Therapeutic exercise;Therapeutic activities;Patient/family education;Manual techniques;Dry needling;Spinal Manipulations;Joint Manipulations    PT Next Visit Plan check pelvic alignment, use foam roll on right hip, work on levator and core strength    PT Home Exercise Plan Access Code:    Recommended Other Services MD signed initial eval    Consulted and Agree with Plan of Care Patient           Patient will benefit from skilled therapeutic intervention in order to improve the following deficits and impairments:  Decreased coordination, Increased fascial restricitons, Pain, Decreased strength, Decreased mobility, Impaired flexibility  Visit Diagnosis: Muscle weakness (generalized)  Cramp and spasm  Testicular pain, right     Problem List Patient Active Problem List   Diagnosis Date Noted  . Heartburn 01/18/2020  . Constipation 01/18/2020  . Gastroesophageal reflux disease 09/17/2017  . Abdominal pain, epigastric 09/17/2017  . Dysphagia 09/17/2017    11/15/2017, PT 06/28/20 1:16 PM   [facility] [address], [address] [place], [place], [place] Phone: [phone]   Fax:  [phone]  Name: Thomas Payne MRN: Henrietta Hoover Date of Birth: Jan 07, 1986

## 2020-06-28 NOTE — Patient Instructions (Signed)
Access Code: [email or web address] URL: [email or web address] Date: 06/28/2020 Prepared by: Eulis Foster  Exercises Supine Figure 4 Piriformis Stretch - 1 x daily - 7 x weekly - 1 sets - 2 reps - 30 sec hold Supine Single Knee to Chest Stretch - 1 x daily - 7 x weekly - 1 sets - 2 reps - 30 sec hold Half Kneeling Hip Flexor Stretch - 1 x daily - 7 x weekly - 1 sets - 2 reps - 30 sec hold Prone Press Up - 1 x daily - 7 x weekly - 1 sets - 10 reps [facility] [address], [address] [place], [place] [address] Phone # [phone] Fax [phone]

## 2020-07-12 ENCOUNTER — Telehealth: Payer: Self-pay | Admitting: Physical Therapy

## 2020-07-12 ENCOUNTER — Encounter: Payer: Self-pay | Admitting: Physical Therapy

## 2020-07-12 NOTE — Telephone Encounter (Signed)
Called patient about the appointment he missed today at 11:00. Left a message.  Eulis Foster, PT @10 /28/2021@ 11:22 AM

## 2020-07-18 ENCOUNTER — Ambulatory Visit: Payer: Self-pay | Attending: Urology | Admitting: Physical Therapy

## 2020-07-18 ENCOUNTER — Telehealth: Payer: Self-pay | Admitting: Physical Therapy

## 2020-07-18 DIAGNOSIS — R252 Cramp and spasm: Secondary | ICD-10-CM | POA: Insufficient documentation

## 2020-07-18 DIAGNOSIS — M6281 Muscle weakness (generalized): Secondary | ICD-10-CM | POA: Insufficient documentation

## 2020-07-18 DIAGNOSIS — N50811 Right testicular pain: Secondary | ICD-10-CM | POA: Insufficient documentation

## 2020-07-18 NOTE — Telephone Encounter (Signed)
Therapist called the patient with the interpreter to see why he no showed for his appointment today at 11:45. Patient reported he had COVID and still showing symptoms. Patient was made aware of his next visit and to call is he is not able to make it.  Eulis Foster, PT @11 /11/2019@ 12:11 PM

## 2020-07-25 ENCOUNTER — Encounter: Payer: Self-pay | Admitting: Physical Therapy

## 2020-07-25 ENCOUNTER — Ambulatory Visit: Payer: Self-pay | Admitting: Physical Therapy

## 2020-07-25 ENCOUNTER — Other Ambulatory Visit: Payer: Self-pay

## 2020-07-25 DIAGNOSIS — R252 Cramp and spasm: Secondary | ICD-10-CM

## 2020-07-25 DIAGNOSIS — M6281 Muscle weakness (generalized): Secondary | ICD-10-CM

## 2020-07-25 DIAGNOSIS — N50811 Right testicular pain: Secondary | ICD-10-CM

## 2020-07-25 NOTE — Therapy (Signed)
[facility] [address], [address] [place], [place], [address] Phone: [phone]   Fax:  [phone]  Physical Therapy Treatment  Patient Details  Name: Thomas Payne MRN: [id number] Date of Birth: 1985-11-17 Referring Provider (PT): Dr. Berniece Salines   Encounter Date: 07/25/2020   PT End of Session - 07/25/20 0817    Visit Number 3    Date for PT Re-Evaluation 09/12/20    Authorization Type self pay    PT Start Time [phone]   came late   PT Stop Time 0845    PT Time Calculation (min) 33 min    Activity Tolerance Patient tolerated treatment well;No increased pain    Behavior During Therapy WFL for tasks assessed/performed           Past Medical History:  Diagnosis Date  . GERD (gastroesophageal reflux disease)     Past Surgical History:  Procedure Laterality Date  . CYST REMOVAL HAND Left     There were no vitals filed for this visit.   Subjective Assessment - 07/25/20 0812    Subjective I was out due to covid. It feels good. Penis pain is less.    Patient is accompained by: Interpreter    Patient Stated Goals reduce pain    Currently in Pain? Yes    Pain Score 4     Pain Location Penis   testicular   Pain Orientation Mid    Pain Descriptors / Indicators Tightness;Squeezing    Pain Type Chronic pain    Pain Onset More than a month ago    Pain Frequency Intermittent    Aggravating Factors  just comes the pain    Pain Relieving Factors medication    Multiple Pain Sites No              [facility] PT Assessment - 07/25/20 0001      PROM   Right Hip Flexion 110    Right Hip External Rotation  55    Left Hip External Rotation  50      Palpation   SI assessment  ASIS are equal                         [facility] Adult PT Treatment/Exercise - 07/25/20 0001      Self-Care   Self-Care Other Self-Care Comments    Other Self-Care Comments  educated patient on how to do trigger point  release of the right psoas in prone; hos to work the trigger points in the right hip adductor and roller to roll out the muscle      Lumbar Exercises: Stretches   Single Knee to Chest Stretch Right;Left;1 rep;30 seconds      Manual Therapy   Manual Therapy Myofascial release;Soft tissue mobilization    Soft tissue mobilization using the addaday to the right quadratus especially at the thoracic lumbar junction, right levator ani outside pants, right gluteal in left sidely    Myofascial Release sitting with release of the right hip adductor using his fingers and the muscle roller                  PT Education - 07/25/20 0847    Education Details educated patient on self trigger point release of the right hip adductor and psoas    Person(s) Educated Other (comment)   interpreter   Methods Explanation;Demonstration;Verbal cues;Handout    Comprehension Verbalized understanding;Returned demonstration  PT Short Term Goals - 07/25/20 0818      PT SHORT TERM GOAL #1   Title independent with initial HEP    Time 4    Period Weeks    Status On-going             PT Long Term Goals - 06/20/20 1405      PT LONG TERM GOAL #1   Title independent with advanced HEP    Time 12    Period Weeks    Status New    Target Date 09/12/20      PT LONG TERM GOAL #2   Title testicular pain during the day reduces >/= 75% due to improve tissue mobility and reduction of trigger points    Time 12    Period Weeks    Status New    Target Date 09/12/20      PT LONG TERM GOAL #3   Title after a game if soccer improved right groin and buttock pain decreased >/= 75% due to correct aignment of the pelvis and elongation fo tissue    Time 12    Period Weeks    Status New    Target Date 09/12/20      PT LONG TERM GOAL #4   Title pain with running decreased >/= 75% due to right hip abduction strength >/= 5/5    Time 12    Period Weeks    Status New    Target Date 09/12/20                  Plan - 07/25/20 0848    Clinical Impression Statement Patient reports his pain is not intermittent and less. Patient had COVID so he was not as active to challenge his right hip. Patient has increased bilateral hip external rotation and right hip flexion passively. Patiet has trigger points in the right quadratus, psoas, and levator ani. Therapist explained with the interpreter on how she will do manual work to the penis and scrotum if it is appropriate for next treatment. Patient will benefit from skilled therapy to work on mobility and reduce his pain.    Personal Factors and Comorbidities Profession    Examination-Activity Limitations Squat;Carry;Lift    Examination-Participation Restrictions Occupation    Stability/Clinical Decision Making Stable/Uncomplicated    Rehab Potential Excellent    PT Frequency 1x / week    PT Duration 12 weeks    PT Treatment/Interventions Biofeedback;Cryotherapy;Electrical Stimulation;Iontophoresis 4mg /ml Dexamethasone;Moist Heat;Ultrasound;Neuromuscular re-education;Therapeutic exercise;Therapeutic activities;Patient/family education;Manual techniques;Dry needling;Spinal Manipulations;Joint Manipulations    PT Next Visit Plan work on soft tissue of pelvic floor muscles with release by the penis and scrotum, work hip external rotators    PT Home Exercise Plan Access Code:    Consulted and Agree with Plan of Care Patient;Other (Comment)   interpreter          Patient will benefit from skilled therapeutic intervention in order to improve the following deficits and impairments:  Decreased coordination, Increased fascial restricitons, Pain, Decreased strength, Decreased mobility, Impaired flexibility  Visit Diagnosis: Muscle weakness (generalized)  Cramp and spasm  Testicular pain, right     Problem List Patient Active Problem List   Diagnosis Date Noted  . Heartburn 01/18/2020  . Constipation 01/18/2020  . Gastroesophageal  reflux disease 09/17/2017  . Abdominal pain, epigastric 09/17/2017  . Dysphagia 09/17/2017    11/15/2017, PT 07/25/20 8:53 AM   [facility] [address] [place],  [place], [address] Phone: [phone]   Fax:  [phone]  Name: Romulus Hanrahan MRN: [id number] Date of Birth: 1986/08/23

## 2020-07-30 ENCOUNTER — Ambulatory Visit: Payer: Self-pay | Admitting: Physical Therapy

## 2020-07-30 ENCOUNTER — Other Ambulatory Visit: Payer: Self-pay

## 2020-07-30 ENCOUNTER — Encounter: Payer: Self-pay | Admitting: Physical Therapy

## 2020-07-30 DIAGNOSIS — N50811 Right testicular pain: Secondary | ICD-10-CM

## 2020-07-30 DIAGNOSIS — M6281 Muscle weakness (generalized): Secondary | ICD-10-CM

## 2020-07-30 DIAGNOSIS — R252 Cramp and spasm: Secondary | ICD-10-CM

## 2020-07-30 NOTE — Therapy (Signed)
[facility] [address], [address] [place], [place], [address] Phone: [phone]   Fax:  [phone]  Physical Therapy Treatment  Patient Details  Name: Thomas Payne MRN: [id number] Date of Birth: 12/22/1985 Referring Provider (PT): Dr. Berniece Salines   Encounter Date: 07/30/2020   PT End of Session - 07/30/20 0842    Visit Number 4    Date for PT Re-Evaluation 09/12/20    Authorization Type self pay    PT Start Time 0820   came late   PT Stop Time 0840    PT Time Calculation (min) 20 min    Activity Tolerance Patient tolerated treatment well;No increased pain    Behavior During Therapy WFL for tasks assessed/performed           Past Medical History:  Diagnosis Date  . GERD (gastroesophageal reflux disease)     Past Surgical History:  Procedure Laterality Date  . CYST REMOVAL HAND Left     There were no vitals filed for this visit.   Subjective Assessment - 07/30/20 0822    Subjective i still have a little pain. I think the pain has changed a little bet.    Patient Stated Goals reduce pain    Currently in Pain? Yes    Pain Score 4     Pain Location Penis   testicular   Pain Orientation Mid    Pain Descriptors / Indicators Squeezing;Tightness    Pain Type Chronic pain    Pain Onset More than a month ago    Pain Frequency Intermittent    Aggravating Factors  just comes the pain, when he has sex    Pain Relieving Factors medication              [facility] PT Assessment - 07/30/20 0001      Strength   Right Hip ABduction 4/5                         [facility] Adult PT Treatment/Exercise - 07/30/20 0001      Manual Therapy   Manual Therapy Soft tissue mobilization    Manual therapy comments educated patient on how to perform the soft tissue work to himself     Soft tissue mobilization to the right bulbocavernosus, ischiocavernosus, pernieal body and superficial transverse                   PT Education - 07/30/20 0841    Education Details educated patient on trigger point release of the right pelvic floor muscles    Person(s) Educated Patient;Other (comment)   interpreter   Methods Explanation;Demonstration;Verbal cues    Comprehension Verbalized understanding;Returned demonstration            PT Short Term Goals - 07/30/20 0845      PT SHORT TERM GOAL #1   Title independent with initial HEP    Time 4    Period Weeks    Status Achieved             PT Long Term Goals - 07/30/20 0825      PT LONG TERM GOAL #1   Title independent with advanced HEP    Period Weeks    Status On-going      PT LONG TERM GOAL #2   Title testicular pain during the day reduces >/= 75% due to improve tissue mobility and reduction of trigger points    Time 12  Period Weeks    Status On-going      PT LONG TERM GOAL #3   Title after a game if soccer improved right groin and buttock pain decreased >/= 75% due to correct aignment of the pelvis and elongation fo tissue    Time 12    Period Weeks    Status Achieved      PT LONG TERM GOAL #4   Title pain with running decreased >/= 75% due to right hip abduction strength >/= 5/5    Baseline has not been running, 4/5    Time 12    Period Weeks    Status On-going                 Plan - 07/30/20 8315    Clinical Impression Statement Patient right hip abduction increased from 4-/5 to 4/5. Patient is not having right hip pain but not able to play soccer due to getting over COVID. Patient has learned how to work on the trigger points himself at the pelvic floor. After therapy there was no palpable tenderness located in the pelvic floor muscles. Patient will benefit from skilled therapy to work on mobility of tissue and reduce pain.    Examination-Participation Restrictions Occupation    Stability/Clinical Decision Making Stable/Uncomplicated    Rehab Potential Excellent    PT Frequency 1x / week    PT Duration  12 weeks    PT Treatment/Interventions Biofeedback;Cryotherapy;Electrical Stimulation;Iontophoresis 4mg /ml Dexamethasone;Moist Heat;Ultrasound;Neuromuscular re-education;Therapeutic exercise;Therapeutic activities;Patient/family education;Manual techniques;Dry needling;Spinal Manipulations;Joint Manipulations    PT Next Visit Plan work on soft tissue of pelvic floor muscles with release by the penis and scrotum, gently holding penis and elongating the right pelvic floor; work hip external rotators; see if continues or not    PT Home Exercise Plan Access Code:    Consulted and Agree with Plan of Care Patient;Other (Comment)   interpreter          Patient will benefit from skilled therapeutic intervention in order to improve the following deficits and impairments:  Decreased coordination, Increased fascial restricitons, Pain, Decreased strength, Decreased mobility, Impaired flexibility  Visit Diagnosis: Muscle weakness (generalized)  Cramp and spasm  Testicular pain, right     Problem List Patient Active Problem List   Diagnosis Date Noted  . Heartburn 01/18/2020  . Constipation 01/18/2020  . Gastroesophageal reflux disease 09/17/2017  . Abdominal pain, epigastric 09/17/2017  . Dysphagia 09/17/2017    11/15/2017, PT 07/30/20 8:46 AM   [facility] [address] [place], [place], [place] Phone: [phone]   Fax:  [phone]  Name: Thomas Payne MRN: Henrietta Hoover Date of Birth: 07/30/1986

## 2020-08-08 ENCOUNTER — Ambulatory Visit: Payer: Self-pay | Admitting: Physical Therapy

## 2020-08-08 ENCOUNTER — Encounter: Payer: Self-pay | Admitting: Physical Therapy

## 2020-08-08 ENCOUNTER — Other Ambulatory Visit: Payer: Self-pay

## 2020-08-08 DIAGNOSIS — R252 Cramp and spasm: Secondary | ICD-10-CM

## 2020-08-08 DIAGNOSIS — N50811 Right testicular pain: Secondary | ICD-10-CM

## 2020-08-08 DIAGNOSIS — M6281 Muscle weakness (generalized): Secondary | ICD-10-CM

## 2020-08-08 NOTE — Therapy (Signed)
[facility] [address], [address] [place], [place], [address] Phone: [phone]   Fax:  [phone]  Physical Therapy Treatment  Patient Details  Name: Thomas Payne MRN: [id number] Date of Birth: 02-Dec-1985 Referring Provider (PT): Dr. Berniece Salines   Encounter Date: 08/08/2020   PT End of Session - 08/08/20 0807    Visit Number 5    Date for PT Re-Evaluation 09/12/20    Authorization Type self pay    PT Start Time 0800    PT Stop Time 0840    PT Time Calculation (min) 40 min    Activity Tolerance Patient tolerated treatment well;No increased pain    Behavior During Therapy WFL for tasks assessed/performed           Past Medical History:  Diagnosis Date  . GERD (gastroesophageal reflux disease)     Past Surgical History:  Procedure Laterality Date  . CYST REMOVAL HAND Left     There were no vitals filed for this visit.   Subjective Assessment - 08/08/20 0805    Subjective The pain is not as strong. Last visit did not help much. The stretching helps most.    Patient is accompained by: Interpreter    Patient Stated Goals reduce pain    Currently in Pain? Yes    Pain Score 4     Pain Location Penis    Pain Orientation Mid    Pain Descriptors / Indicators Squeezing;Tightness    Pain Type Chronic pain    Pain Onset More than a month ago    Pain Frequency Intermittent    Aggravating Factors  pain just comes    Pain Relieving Factors no    Multiple Pain Sites No                             [facility] Adult PT Treatment/Exercise - 08/08/20 0001      Lumbar Exercises: Stretches   Piriformis Stretch Right;3 reps;30 seconds    Piriformis Stretch Limitations supine with right hip ER      Manual Therapy   Manual Therapy Soft tissue mobilization;Joint mobilization    Manual therapy comments contract relax to stretch into the butterfly stretch for the right hip    Joint Mobilization  using the mulligan belt to mobilize the right hip for inferior glide, posterior glide, and distraction; anterior righ thip glide with hip extension    Soft tissue mobilization using the addaday to the right hip rotators, gluteus, and hamstring; soft tissue work to [employer]. levator ani, and coccygeus, and obturator internist                    PT Short Term Goals - 07/30/20 0845      PT SHORT TERM GOAL #1   Title independent with initial HEP    Time 4    Period Weeks    Status Achieved             PT Long Term Goals - 08/08/20 7939      PT LONG TERM GOAL #1   Title independent with advanced HEP    Time 12    Period Weeks    Status On-going      PT LONG TERM GOAL #2   Title testicular pain during the day reduces >/= 75% due to improve tissue mobility and reduction of trigger points    Baseline 25% better  Time 12    Period Weeks    Status On-going      PT LONG TERM GOAL #3   Title after a game if soccer improved right groin and buttock pain decreased >/= 75% due to correct aignment of the pelvis and elongation fo tissue    Period Weeks    Status Achieved      PT LONG TERM GOAL #4   Title pain with running decreased >/= 75% due to right hip abduction strength >/= 5/5    Baseline has not been running, 4/5    Time 12    Period Weeks    Status On-going                 Plan - 08/08/20 0840    Clinical Impression Statement Patient feels the pain is not as intense and at level 4/10. He has tightness in the posterior right hip and right hip external rotators. Patient pain comes without a cause. Patient needed his stretches printed out again. Patiet has not been running yet to play soccer. Patient will beneiit from skilled therapy to work on mobility of tissue and reduce pain.    Personal Factors and Comorbidities Profession    Examination-Activity Limitations Squat;Carry;Lift    Examination-Participation Restrictions Occupation    Stability/Clinical Decision  Making Stable/Uncomplicated    Rehab Potential Excellent    PT Frequency 1x / week    PT Duration 12 weeks    PT Treatment/Interventions Biofeedback;Cryotherapy;Electrical Stimulation;Iontophoresis 4mg /ml Dexamethasone;Moist Heat;Ultrasound;Neuromuscular re-education;Therapeutic exercise;Therapeutic activities;Patient/family education;Manual techniques;Dry needling;Spinal Manipulations;Joint Manipulations    PT Next Visit Plan continue to work on hip external rotation, see if patient needs a renewal, reverse clams    PT Home Exercise Plan Access Code:    Consulted and Agree with Plan of Care Patient;Other (Comment)   interpreter          Patient will benefit from skilled therapeutic intervention in order to improve the following deficits and impairments:  Decreased coordination, Increased fascial restricitons, Pain, Decreased strength, Decreased mobility, Impaired flexibility  Visit Diagnosis: Muscle weakness (generalized)  Cramp and spasm  Testicular pain, right     Problem List Patient Active Problem List   Diagnosis Date Noted  . Heartburn 01/18/2020  . Constipation 01/18/2020  . Gastroesophageal reflux disease 09/17/2017  . Abdominal pain, epigastric 09/17/2017  . Dysphagia 09/17/2017    11/15/2017, PT 08/08/20 8:45 AM   [facility] [address] [place], [place], [place] Phone: [phone]   Fax:  [phone]  Name: Thomas Payne MRN: Henrietta Hoover Date of Birth: [birth year]

## 2020-09-17 ENCOUNTER — Ambulatory Visit: Payer: Self-pay | Admitting: Physical Therapy

## 2020-09-27 ENCOUNTER — Ambulatory Visit: Payer: Self-pay | Admitting: Physical Therapy

## 2020-09-27 ENCOUNTER — Encounter: Payer: Self-pay | Admitting: Gastroenterology

## 2020-09-27 ENCOUNTER — Ambulatory Visit: Payer: Self-pay | Admitting: Gastroenterology

## 2020-09-27 VITALS — BP 116/68 | HR 59 | Ht 60.0 in | Wt 172.0 lb

## 2020-09-27 DIAGNOSIS — R12 Heartburn: Secondary | ICD-10-CM

## 2020-09-27 DIAGNOSIS — R1013 Epigastric pain: Secondary | ICD-10-CM

## 2020-09-27 NOTE — Patient Instructions (Signed)
If you are age 36 or older, your body mass index should be between 23-30. Your Body mass index is 33.59 kg/m. If this is out of the aforementioned range listed, please consider follow up with your Primary Care Provider.  If you are age 13 or younger, your body mass index should be between 19-25. Your Body mass index is 33.59 kg/m. If this is out of the aformentioned range listed, please consider follow up with your Primary Care Provider.   [facility]  Ground level of [facility] ( go thru ER, take elevator to ground level)   It was a pleasure to see you today!  Dr. Myrtie Neither

## 2020-09-27 NOTE — Progress Notes (Signed)
[facility] Progress Note  Chief Complaint: GERD  Subjective  History: Patient was last seen summer 2019 for several years of GERD symptoms and then more recent onset esophageal dysphagia.  He had heartburn, epigastric discomfort and bloating.  Upper endoscopy 04/22/2018 was normal (including no mucosal changes of EOE), and biopsies negative for H. Pylori. Thomas Payne was seen again in May of last year for similar symptoms, CT abdomen and pelvis was done, normal with report below  He is here today with ongoing upper abdominal pain and feelings of food "stuck in the upper abdomen" after eating.  Acid suppression has not been helping.  Really what he seems to mean is that eating makes a pressure sensation in the lower chest or upper abdomen and that makes him feel anxious and short of breath he wakes up at night lately short of breath and was recently diagnosed with anxiety.  He saw a clinic on [facility], was given some medications but had to stop because they upset his stomach he feels that if he eats larger portions he is more short of breath and anxious.  His appetite is generally good and weight stable.  Some mornings he wakes up feeling bloated, bowel habits are regular and denies rectal bleeding.  ROS: Cardiovascular:  no chest pain Respiratory: No cough No dysuria Remainder of systems negative except as above   The patient's Past Medical, Family and Social History were reviewed and are on file in the EMR.  Objective:  Med list reviewed No current outpatient medications on file.   Vital signs in last 24 hrs: Vitals:   09/27/20 1427  BP: 116/68  Pulse: (!) 59  SpO2: 99%   Wt Readings from Last 3 Encounters:  09/27/20 172 lb (78 kg)  01/18/20 174 lb 12.8 oz (79.3 kg)  04/22/18 170 lb (77.1 kg)    Physical Exam Interpreter present for entire visit Well-appearing  HEENT: sclera anicteric, oral mucosa moist without lesions  Neck: supple, no thyromegaly, JVD or  lymphadenopathy  Cardiac: RRR without murmurs, S1S2 heard, no peripheral edema  Pulm: clear to auscultation bilaterally, normal RR and effort noted  Abdomen: soft, no tenderness, with active bowel sounds. No guarding or palpable hepatosplenomegaly.  Skin; warm and dry, no jaundice or rash  Labs:   ___________________________________________ Radiologic studies:  CLINICAL DATA:  Chronic lower abdominal pain and constipation, for approximately 1 year.   EXAM: CT ABDOMEN AND PELVIS WITH CONTRAST   TECHNIQUE: Multidetector CT imaging of the abdomen and pelvis was performed using the standard protocol following bolus administration of intravenous contrast.   CONTRAST:  OMNIPAQUE IOHEXOL 300 MG/ML  SOLN   COMPARISON:  None.   FINDINGS: Image degradation by motion artifact noted from mainly within the abdomen.   Lower Chest: No acute findings.   Hepatobiliary: No hepatic masses identified. Gallbladder is unremarkable. No evidence of biliary ductal dilatation.   Pancreas:  No mass or inflammatory changes.   Spleen: Within normal limits in size and appearance.   Adrenals/Urinary Tract: No masses identified. No evidence of ureteral calculi or hydronephrosis.   Stomach/Bowel: No evidence of obstruction, inflammatory process or abnormal fluid collections. Normal appendix visualized.   Vascular/Lymphatic: No pathologically enlarged lymph nodes. No abdominal aortic aneurysm.   Reproductive:  No mass or other significant abnormality.   Other:  None.   Musculoskeletal:  No suspicious bone lesions identified.   IMPRESSION: Negative. No acute findings or other significant abnormality.     Electronically Signed  By: Danae Orleans M.D.   On: 01/26/2020 15:54  (Results of the scan was given to patient today, as he does not recall receiving a phone call about it last  year) ____________________________________________ Other:   _____________________________________________ Assessment & Plan  Assessment: Encounter Diagnoses  Name Primary?  . Epigastric pain Yes  . 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 He was not able to get much help from the recent primary care evaluation.  We are working on setting him up for new patient appointment for general medical care and anxiety with the [facility] medical resident clinic.  I explained to him that he does not have any apparent primary digestive disorder based on testing so far.  As such, I doubt further GI specific testing will be revealing.  Acid suppression medicine has not been helping.  We will therefore see him as needed   30 minutes were spent on this encounter (including chart review, history/exam, counseling/coordination of care, and documentation) > 50% of that time was spent on counseling and coordination of care.  Topics discussed included: Abdominal pain, heartburn, anxiety disorder.  Thomas Payne

## 2020-10-04 ENCOUNTER — Other Ambulatory Visit: Payer: Self-pay

## 2020-10-04 ENCOUNTER — Encounter: Payer: Self-pay | Admitting: Physical Therapy

## 2020-10-04 ENCOUNTER — Other Ambulatory Visit (HOSPITAL_COMMUNITY): Payer: Self-pay | Admitting: Urology

## 2020-10-04 ENCOUNTER — Ambulatory Visit: Payer: Self-pay | Attending: Urology | Admitting: Physical Therapy

## 2020-10-04 DIAGNOSIS — M6281 Muscle weakness (generalized): Secondary | ICD-10-CM | POA: Insufficient documentation

## 2020-10-04 DIAGNOSIS — N50811 Right testicular pain: Secondary | ICD-10-CM | POA: Insufficient documentation

## 2020-10-04 DIAGNOSIS — R252 Cramp and spasm: Secondary | ICD-10-CM | POA: Insufficient documentation

## 2020-10-04 MED FILL — DIAZEPAM 10 MG TABS: 10 | 30 days supply | Qty: 30 | Fill #0

## 2020-10-04 NOTE — Patient Instructions (Signed)
Access Code: [email or web address] URL: [email or web address] Date: 10/04/2020 Prepared by: Eulis Foster  Program Notes sit on a ball and roll it front and back to massage the muscle.  then roll under thigh 20x each thigh    Exercises Seated Piriformis Stretch - 1 x daily - 7 x weekly - 1 sets - 2 reps - 30 sec hold Seated Hip Adductor Stretch - 1 x daily - 7 x weekly - 1 sets - 2 reps - 30 sec hold Hip Flexor Stretch with Chair - 1 x daily - 7 x weekly - 1 sets - 2 reps - 30 sec hold Hip Hinge Rock Back - 1 x daily - 7 x weekly - 1 sets - 10 reps Seated Diaphragmatic Breathing - 1 x daily - 7 x weekly - 1 sets - 10 reps [facility] [address] [place], [place] [address] Phone # [phone] Fax [phone]

## 2020-10-04 NOTE — Therapy (Signed)
[facility] [address], [place], [place], [address] Phone: [phone]   Fax:  [phone]  Physical Therapy Treatment/Renewal/Discharge  Patient Details  Name: Thomas Payne MRN: [id number] Date of Birth: 08-Dec-1985 Referring Provider (PT): Dr. Louis Meckel   Encounter Date: 10/04/2020   PT End of Session - 10/04/20 1654    Visit Number 6    PT Start Time 7824    PT Stop Time 2353    PT Time Calculation (min) 38 min    Activity Tolerance Patient tolerated treatment well;No increased pain    Behavior During Therapy WFL for tasks assessed/performed           Past Medical History:  Diagnosis Date  . GERD (gastroesophageal reflux disease)     Past Surgical History:  Procedure Laterality Date  . CYST REMOVAL HAND Left     There were no vitals filed for this visit.   Subjective Assessment - 10/04/20 1619    Subjective No changes since last visit. I saw the doctor and the prescibed muscle relaxants and pain medication. The MD feels the muscle are too stressed or tight.    Patient is accompained by: Interpreter    Patient Stated Goals reduce pain    Currently in Pain? 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 Louis Meckel    Prior Therapy none      Precautions   Precautions None      Restrictions   Weight Bearing Restrictions No      Cognition   Overall Cognitive Status Within Functional Limits for tasks assessed      Posture/Postural Control   Posture/Postural Control No significant limitations      AROM   Lumbar Flexion full    Lumbar Extension full    Lumbar - Right Side Bend full    Lumbar - Left Side Bend full    Lumbar - Right Rotation full      PROM   Right Hip Flexion 110    Right Hip External Rotation  55    Left Hip  Flexion 115    Left Hip External Rotation  50      Strength   Right Hip ABduction 4/5                      Pelvic Floor Special Questions - 10/04/20 0001    Urinary Leakage No    Exam Type Deferred             [facility] Adult PT Treatment/Exercise - 10/04/20 0001      Self-Care   Self-Care Other Self-Care Comments    Other Self-Care Comments  using a tennis ball to massage the pelvic floor in sitting and the hamstring in sitting      Lumbar Exercises: Stretches   Hip Flexor Stretch Right;Left;1 rep;30 seconds    Hip Flexor Stretch Limitations foot on chair    Quadruped Mid Back Stretch 5 reps;10 seconds    Quadruped Mid Back Stretch Limitations hip hinging    Piriformis Stretch Right;Left;1 rep    Piriformis Stretch Limitations sitting    Other Lumbar Stretch Exercise sitting hip adductor stretch right , left 30 seconds each  PT Education - 10/04/20 1647    Education Details Access Code: [email or web address]    Person(s) Educated Patient    Methods Explanation;Demonstration;Verbal cues;Handout    Comprehension Returned demonstration;Verbalized understanding            PT Short Term Goals - 10/04/20 1659      PT SHORT TERM GOAL #1   Title independent with initial HEP    Time 4    Period Weeks    Status Achieved             PT Long Term Goals - 10/04/20 1659      PT LONG TERM GOAL #1   Title independent with advanced HEP    Time 12    Period Weeks    Status Achieved      PT LONG TERM GOAL #2   Title testicular pain during the day reduces >/= 75% due to improve tissue mobility and reduction of trigger points    Baseline ---    Time 12    Period Weeks    Status Not Met      PT LONG TERM GOAL #3   Title after a game if soccer improved right groin and buttock pain decreased >/= 75% due to correct aignment of the pelvis and elongation fo tissue    Time 12    Period Weeks    Status Achieved      PT LONG TERM GOAL #4   Title  pain with running decreased >/= 75% due to right hip abduction strength >/= 5/5    Baseline has not been running    Time 12    Period Weeks    Status Not Met                 Plan - 10/04/20 1655    Clinical Impression Statement Patient has not been in since 1 month ago due to work, therapist schedule, and the holidays. Patient came in for one more appointment to review his exericses and prepare for discharge. Patient reports he continues to have pain. Patient has seen the doctor and was prescribed Valium to be placed anally for relaxing the muscles. Patient has a HEP that consists of 5 stretches and 1 exercise to massage the muscles of the hamstring. Patient pain level is the same. Patient is being renewed for 1 session to cover this appointment.    Personal Factors and Comorbidities Profession    Examination-Activity Limitations Squat;Carry;Lift    Examination-Participation Restrictions Occupation    Stability/Clinical Decision Making Stable/Uncomplicated    Rehab Potential Excellent    PT Frequency One time visit    PT Duration Other (comment)   1 week   PT Treatment/Interventions Biofeedback;Cryotherapy;Electrical Stimulation;Iontophoresis 59m/ml Dexamethasone;Moist Heat;Ultrasound;Neuromuscular re-education;Therapeutic exercise;Therapeutic activities;Patient/family education;Manual techniques;Dry needling;Spinal Manipulations;Joint Manipulations    PT Next Visit Plan Current HEP    PT Home Exercise Plan Access Code: [email or web address]   Consulted and Agree with Plan of Care Patient;Other (Comment)   interpreter          Patient will benefit from skilled therapeutic intervention in order to improve the following deficits and impairments:  Decreased coordination,Increased fascial restricitons,Pain,Decreased strength,Decreased mobility,Impaired flexibility  Visit Diagnosis: Muscle weakness (generalized)  Cramp and spasm  Testicular pain, right     Problem List Patient Active  Problem List   Diagnosis Date Noted  . Heartburn 01/18/2020  . Constipation 01/18/2020  . Gastroesophageal reflux disease 09/17/2017  . Abdominal pain, epigastric 09/17/2017  . Dysphagia 09/17/2017  Earlie Counts, PT 10/04/20 5:01 PM   [facility] [address] [place], [place], [address] Phone: [phone]   Fax:  [phone]  Name: Myers Tutterow MRN: [id number] Date of Birth: [birth year]  PHYSICAL THERAPY DISCHARGE SUMMARY  Visits from Start of Care: 6  Current functional level related to goals / functional outcomes: Patient has not made significant progress since the initial evaluation. Patient is choosing to stop therapy due to that.    Remaining deficits: See above.    Education / Equipment: HEP Plan: Patient agrees to discharge.  Patient goals were partially met. Patient is being discharged due to the patient's request.  Thank you for the referral. Earlie Counts, PT 10/04/20 5:02 PM  ?????

## 2020-10-08 ENCOUNTER — Encounter: Payer: Self-pay | Admitting: Internal Medicine

## 2020-10-08 ENCOUNTER — Other Ambulatory Visit: Payer: Self-pay | Admitting: Internal Medicine

## 2020-10-08 ENCOUNTER — Ambulatory Visit: Payer: Self-pay | Admitting: Internal Medicine

## 2020-10-08 ENCOUNTER — Other Ambulatory Visit: Payer: Self-pay

## 2020-10-08 VITALS — BP 113/76 | HR 63 | Temp 98.3°F | Ht 62.5 in | Wt 176.7 lb

## 2020-10-08 DIAGNOSIS — F411 Generalized anxiety disorder: Secondary | ICD-10-CM

## 2020-10-08 MED ORDER — SERTRALINE HCL 25 MG PO TABS: 25.0000 mg | ORAL_TABLET | Freq: Every day | ORAL | 0 refills | Status: DC

## 2020-10-08 MED FILL — SERTRALINE HCL 25 MG TABLET: 25 | 30 days supply | Qty: 30 | Fill #0

## 2020-10-08 NOTE — Progress Notes (Unsigned)
   CC: Anxiety  HPI:  Mr.Cherokee Chajchalac Johny Drilling is a 35 y.o. male with a past medical history stated below and presents today for anxiety. Please see problem based assessment and plan for additional details.  Past Medical History:  Diagnosis Date  . GERD (gastroesophageal reflux disease)     No current outpatient medications on file prior to visit.   No current facility-administered medications on file prior to visit.    Family History  Problem Relation Age of Onset  . Colon cancer Neg Hx   . Stomach cancer Neg Hx   . Esophageal cancer Neg Hx     Social History   Socioeconomic History  . Marital status: Single    Spouse name: Not on file  . Number of children: 3  . Years of education: Not on file  . Highest education level: Not on file  Occupational History  . Occupation: Holiday representative  Tobacco Use  . Smoking status: Former Smoker    Quit date: 09/17/2012    Years since quitting: 8.0  . Smokeless tobacco: Never Used  Substance and Sexual Activity  . Alcohol use: No  . Drug use: No  . Sexual activity: Not on file  Other Topics Concern  . Not on file  Social History Narrative  . Not on file   Social Determinants of Health   Financial Resource Strain: Not on file  Food Insecurity: Not on file  Transportation Needs: Not on file  Physical Activity: Not on file  Stress: Not on file  Social Connections: Not on file  Intimate Partner Violence: Not on file    Review of Systems: ROS negative except for what is noted on the assessment and plan.  Vitals:   10/08/20 1534  BP: 113/76  Pulse: 63  Temp: 98.3 F (36.8 C)  TempSrc: Oral  SpO2: 98%  Weight: 176 lb 11.2 oz (80.2 kg)  Height: 5' 2.5" (1.588 m)     Physical Exam: Physical Exam Constitutional:      Appearance: Normal appearance.  HENT:     Head: Normocephalic and atraumatic.  Eyes:     Extraocular Movements: Extraocular movements intact.  Cardiovascular:     Rate and Rhythm: Normal rate.      Pulses: Normal pulses.     Heart sounds: Normal heart sounds.  Pulmonary:     Effort: Pulmonary effort is normal.     Breath sounds: Normal breath sounds.  Abdominal:     General: Bowel sounds are normal.     Palpations: Abdomen is soft.     Tenderness: There is no abdominal tenderness.  Musculoskeletal:        General: Normal range of motion.     Cervical back: Normal range of motion.  Skin:    General: Skin is warm and dry.  Neurological:     Mental Status: He is alert and oriented to person, place, and time. Mental status is at baseline.  Psychiatric:        Mood and Affect: Mood normal.      Assessment & Plan:   See Encounters Tab for problem based charting.  Patient discussed with Dr. Jeral Pinch, D.O. [facility], PGY-2 Pager: [phone], Phone: [phone] Date 10/10/2020 Time 9:36 AM

## 2020-10-08 NOTE — Patient Instructions (Signed)
Thank you, [name] Drilling for allowing [place] to provide your care today. Today we discussed Anxiety.    I have ordered the following labs for you:   Lab Orders     TSH     BMP8+Anion Gap   Tests ordered today:  None  Referrals ordered today:   Referral Orders  No referral(s) requested today     I have ordered the following medication/changed the following medications:   Stop the following medications: There are no discontinued medications.   Start the following medications: Meds ordered this encounter  Medications  . sertraline (ZOLOFT) 25 MG tablet    Sig: Take 1 tablet (25 mg total) by mouth daily. IM program    Dispense:  30 tablet    Refill:  0     Follow up: Telehealth visit in 2 weeks and then follow up appointment in 1 month    Should you have any questions or concerns please call the internal medicine clinic at [phone].     Dellia Cloud, D.O. [facility]

## 2020-10-09 LAB — BMP8+ANION GAP
Anion Gap: 12 mmol/L (ref 10.0–18.0)
BUN/Creatinine Ratio: 12 (ref 9–20)
BUN: 13 mg/dL (ref 6–20)
CO2: 26 mmol/L (ref 20–29)
Calcium: 9.3 mg/dL (ref 8.7–10.2)
Chloride: 102 mmol/L (ref 96–106)
Creatinine, Ser: 1.09 mg/dL (ref 0.76–1.27)
GFR calc Af Amer: 101 mL/min/{1.73_m2} (ref >59)
GFR calc non Af Amer: 87 mL/min/{1.73_m2} (ref >59)
Glucose: 83 mg/dL (ref 65–99)
Potassium: 4.3 mmol/L (ref 3.5–5.2)
Sodium: 140 mmol/L (ref 134–144)

## 2020-10-09 LAB — TSH: TSH: 3.51 u[IU]/mL (ref 0.450–4.500)

## 2020-10-10 ENCOUNTER — Encounter: Payer: Self-pay | Admitting: Internal Medicine

## 2020-10-10 DIAGNOSIS — F411 Generalized anxiety disorder: Secondary | ICD-10-CM | POA: Insufficient documentation

## 2020-10-10 NOTE — Assessment & Plan Note (Signed)
Assessment: Patient is here for evaluation of anxiety.  Patient presents for gastroenterology office where he was diagnosed with functional dyspepsia.  The gastroenterologist noted concern for association with anxiety.  Patient states that he has a longstanding history of mood changes, specifically anxiety and depressed mood.  Patient reports an episode when he was 35 years old where he had signs and symptoms concerning for panic attack that he describes as sudden onset fear, shortness of breath, and this "mountains closing in on him".  He states that these episodes periodically recur every couple of months but he is unable to give it exact interval.  He states that when he was young he noticed that his friends thought he was different because of his mood but was unable to specify.  Does admit to frequently having difficulty with worrying particularly about somatic symptoms.  He states examples like a past history of chest pain, constipation, and testicular pain.  He feels that his worrying is impacting his life.  Additionally he expresses the following symptoms listed below.  He has the following anxiety symptoms: chest pain, difficulty concentrating, fatigue, and panic attacks particularlly when he is out in public. Marland Kitchen  Onset of symptoms was approximately 10 year ago.   Symptoms have been gradually worsening since that time particularly in the last several months.  He denies current suicidal and homicidal ideation.   Family history significant for Unknown.  Will need additional information regarding family history of behavioral health conditions at his next visit.Marland Kitchen Possible organic causes contributing are: none.  No history of anemia, and will test for thyroid dysfunction today. Previous treatment includes none.     GAD7 Score: 14  Plan: 1.  We will get a TSH and BMP to rule out organic causes 2.  We will start sertraline 25 mg daily 3.  We will reevaluate him in 2 weeks via telehealth visit and  then at 1 month to titrate his medications. 4.  GAD-7 should be repeated at every visit to assess his anxiety and response to treatment

## 2020-10-11 NOTE — Progress Notes (Signed)
Internal Medicine Clinic Attending  Case discussed with Dr. Coe  At the time of the visit.  We reviewed the resident's history and exam and pertinent patient test results.  I agree with the assessment, diagnosis, and plan of care documented in the resident's note.  

## 2021-08-21 IMAGING — CT CT ABD-PELV W/ CM
2 of 4 series · 16 of 46 positions shown, 18 images · IV contrast (OMNIPAQUE 300)
Comparison: None.

CLINICAL DATA: Chronic lower abdominal pain and constipation, for
approximately 1 year.

EXAM:
CT ABDOMEN AND PELVIS WITH CONTRAST
TECHNIQUE: Multidetector CT imaging of the abdomen and pelvis was performed
using the standard protocol following bolus administration of
intravenous contrast.
CONTRAST:  100mL OMNIPAQUE IOHEXOL 300 MG/ML  SOLN

[Series 3: abd/pel w · axial · 0.77mm/px · z∈[-431,-11]mm · 13 of 92 slices shown, 15 images]
[im 4/92  soft-tissue]
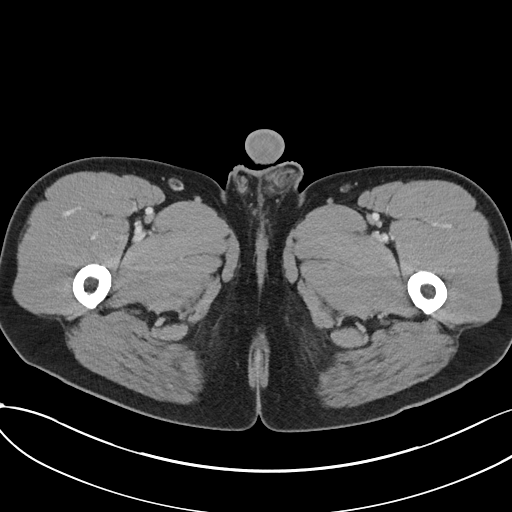
[im 4/92  bone]
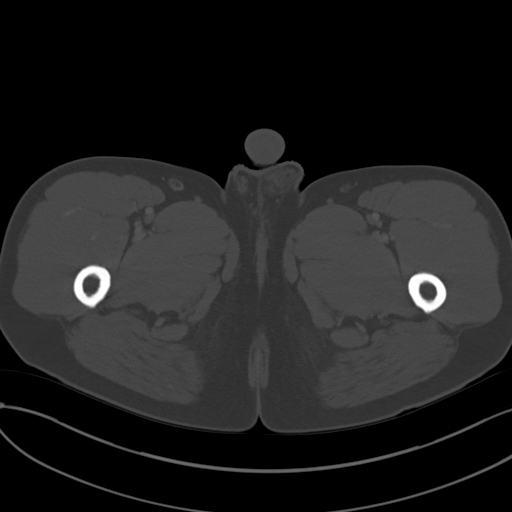
[im 12/92  soft-tissue]
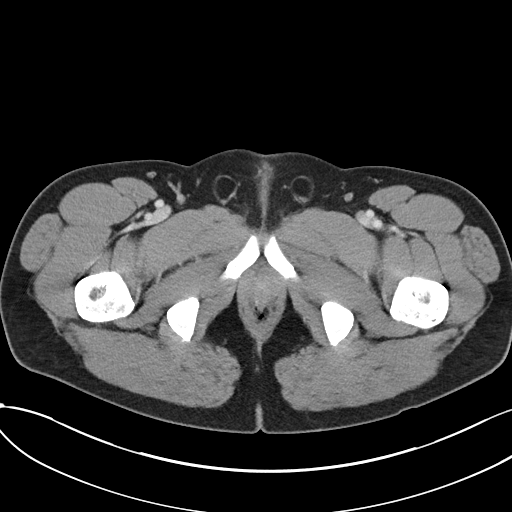
[im 20/92  soft-tissue]
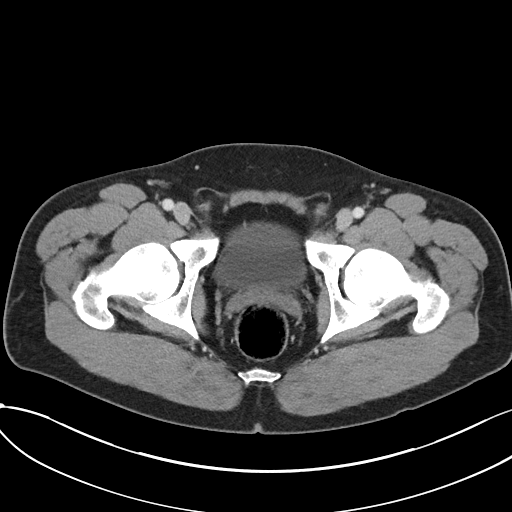
[im 24/92  soft-tissue]
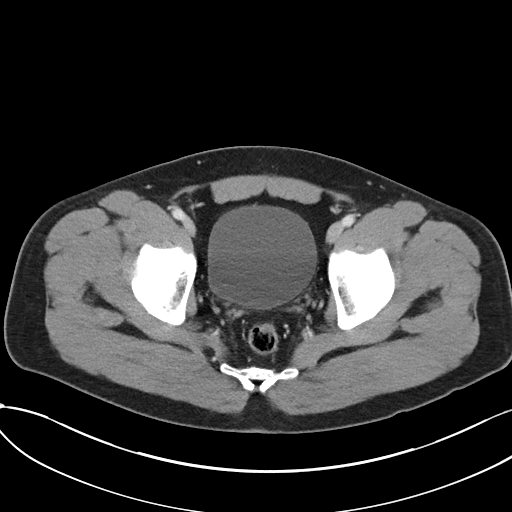
[im 32/92  soft-tissue]
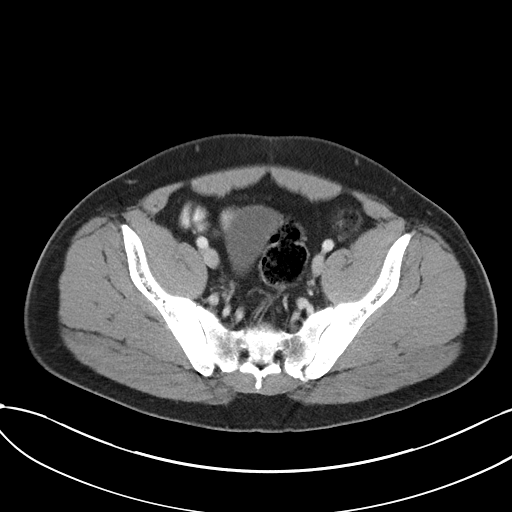
[im 40/92  soft-tissue]
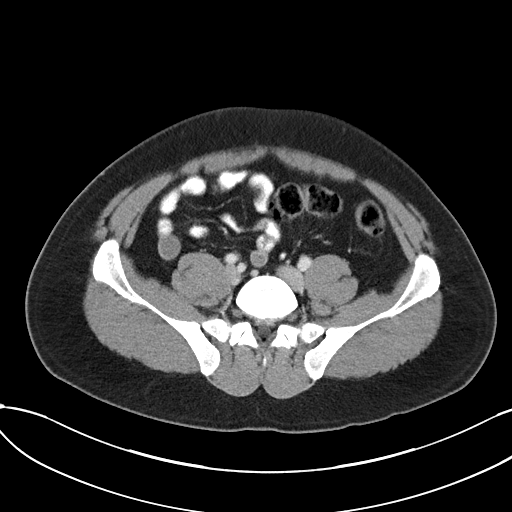
[im 48/92  soft-tissue]
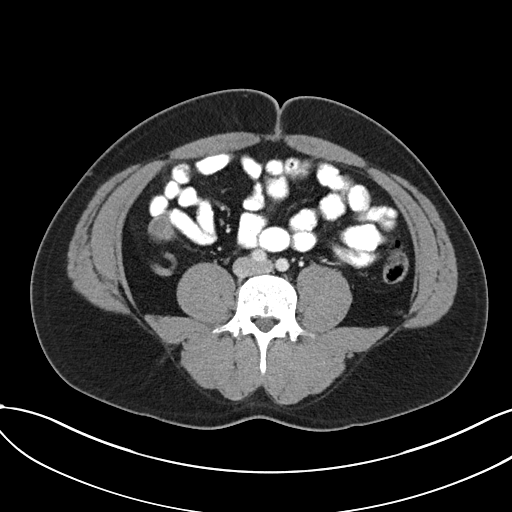
[im 52/92  soft-tissue]
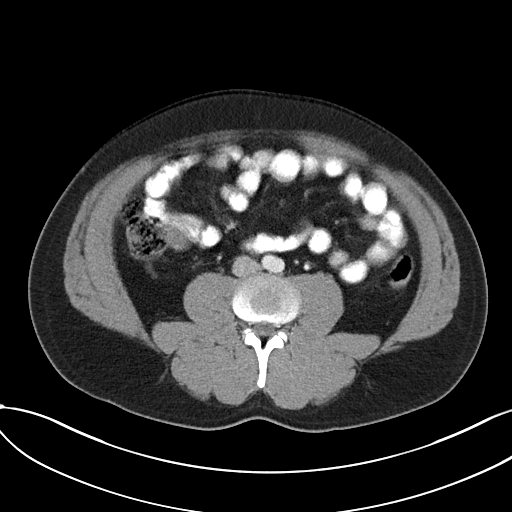
[im 60/92  soft-tissue]
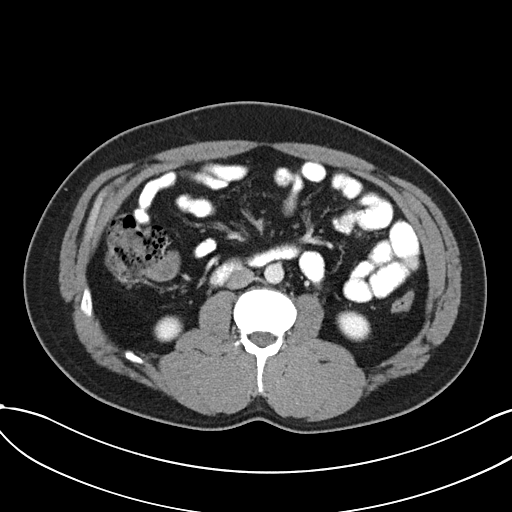
[im 60/92  bone]
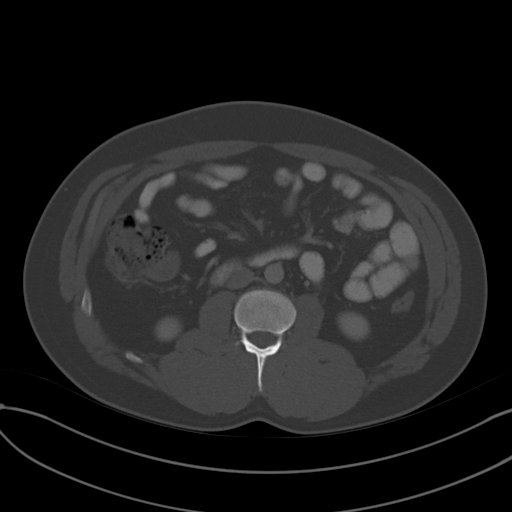
[im 68/92  soft-tissue]
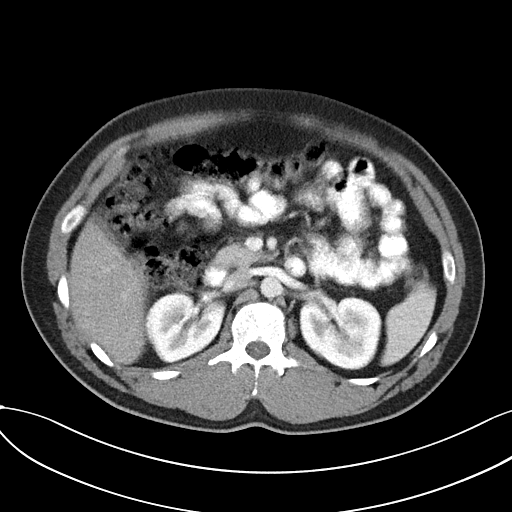
[im 72/92  soft-tissue]
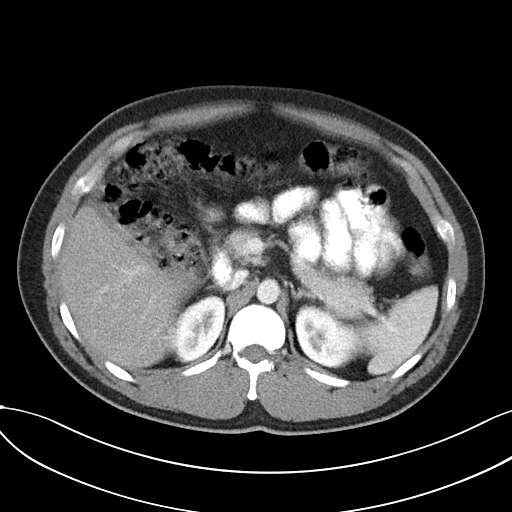
[im 80/92  soft-tissue]
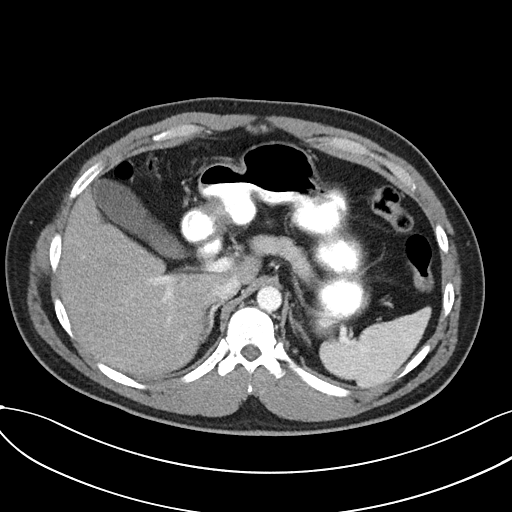
[im 88/92  soft-tissue]
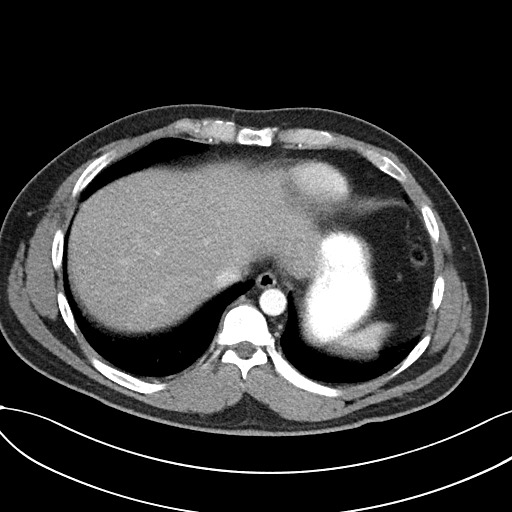

[Series 6: abd/pel w st · coronal · 0.72mm/px · 3 of 82 slices shown]
[im 28/82  soft-tissue]
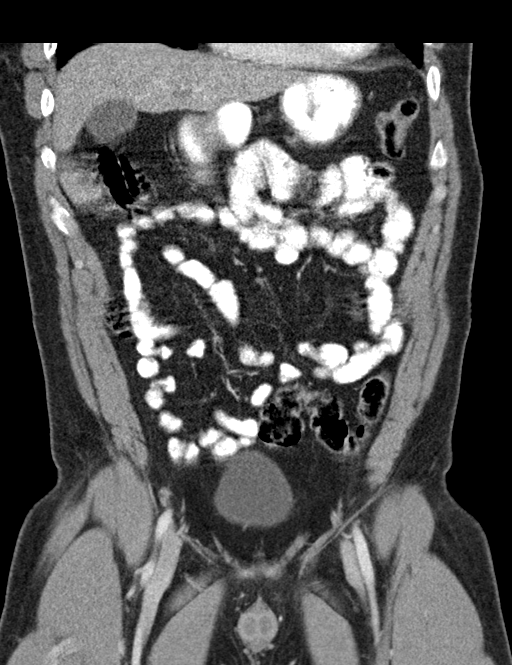
[im 37/82  soft-tissue]
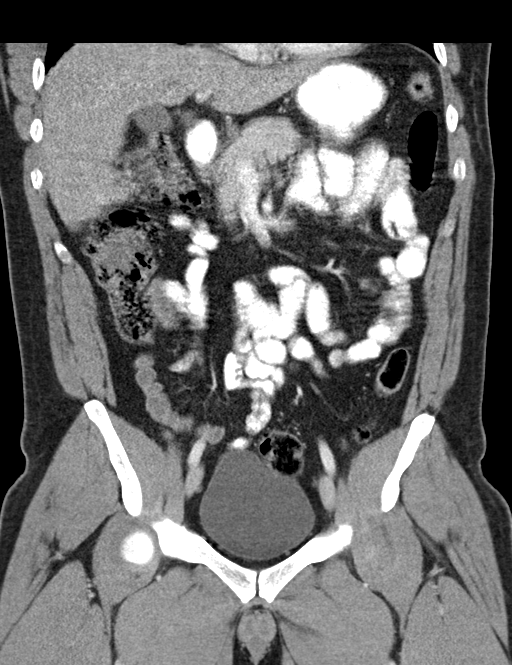
[im 46/82  soft-tissue]
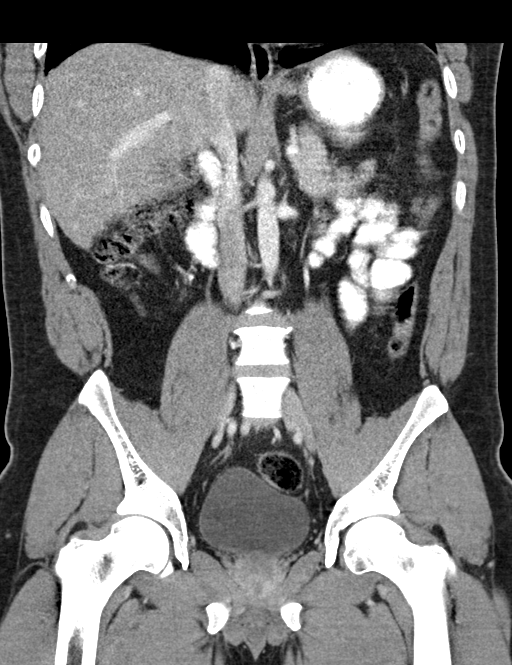

[16 of 46 positions shown; findings below may reference images not displayed]

FINDINGS: Image degradation by motion artifact noted from mainly within the
abdomen.

Lower Chest: No acute findings.

Hepatobiliary: No hepatic masses identified. Gallbladder is
unremarkable. No evidence of biliary ductal dilatation.

Pancreas:  No mass or inflammatory changes.

Spleen: Within normal limits in size and appearance.

Adrenals/Urinary Tract: No masses identified. No evidence of
ureteral calculi or hydronephrosis.

Stomach/Bowel: No evidence of obstruction, inflammatory process or
abnormal fluid collections. Normal appendix visualized.

Vascular/Lymphatic: No pathologically enlarged lymph nodes. No
abdominal aortic aneurysm.

Reproductive:  No mass or other significant abnormality.

Other:  None.

Musculoskeletal:  No suspicious bone lesions identified.
IMPRESSION: Negative. No acute findings or other significant abnormality.
